# Patient Record
Sex: Female | Born: 1965 | Hispanic: Yes | Marital: Single | Smoking: Never smoker
Health system: Southern US, Community
[De-identification: ages and names within clinical notes are randomized; demographics above are authoritative.]

## PROBLEM LIST (undated history)

## (undated) DIAGNOSIS — E162 Hypoglycemia, unspecified: Secondary | ICD-10-CM

## (undated) DIAGNOSIS — R0683 Snoring: Secondary | ICD-10-CM

## (undated) DIAGNOSIS — M810 Age-related osteoporosis without current pathological fracture: Secondary | ICD-10-CM

## (undated) DIAGNOSIS — M1990 Unspecified osteoarthritis, unspecified site: Secondary | ICD-10-CM

## (undated) DIAGNOSIS — I10 Essential (primary) hypertension: Secondary | ICD-10-CM

## (undated) DIAGNOSIS — L4050 Arthropathic psoriasis, unspecified: Secondary | ICD-10-CM

## (undated) HISTORY — DX: Arthropathic psoriasis, unspecified: L40.50

## (undated) HISTORY — DX: Age-related osteoporosis without current pathological fracture: M81.0

## (undated) HISTORY — PX: KNEE SURGERY: SHX244

---

## 2012-07-23 HISTORY — PX: JOINT REPLACEMENT: SHX530

## 2013-03-25 DIAGNOSIS — L4050 Arthropathic psoriasis, unspecified: Secondary | ICD-10-CM

## 2013-03-25 HISTORY — DX: Arthropathic psoriasis, unspecified: L40.50

## 2013-03-29 ENCOUNTER — Encounter (HOSPITAL_COMMUNITY): Payer: Self-pay | Admitting: Emergency Medicine

## 2013-03-29 ENCOUNTER — Emergency Department (HOSPITAL_COMMUNITY)
Admission: EM | Admit: 2013-03-29 | Discharge: 2013-03-29 | Disposition: A | Discharge status: Home / Self Care | Payer: Medicare Other | Attending: Emergency Medicine | Admitting: Emergency Medicine

## 2013-03-29 ENCOUNTER — Emergency Department (HOSPITAL_COMMUNITY): Payer: Medicare Other

## 2013-03-29 DIAGNOSIS — Y929 Unspecified place or not applicable: Secondary | ICD-10-CM | POA: Insufficient documentation

## 2013-03-29 DIAGNOSIS — Z8639 Personal history of other endocrine, nutritional and metabolic disease: Secondary | ICD-10-CM | POA: Insufficient documentation

## 2013-03-29 DIAGNOSIS — I10 Essential (primary) hypertension: Secondary | ICD-10-CM | POA: Insufficient documentation

## 2013-03-29 DIAGNOSIS — S99919A Unspecified injury of unspecified ankle, initial encounter: Principal | ICD-10-CM

## 2013-03-29 DIAGNOSIS — Z79899 Other long term (current) drug therapy: Secondary | ICD-10-CM | POA: Insufficient documentation

## 2013-03-29 DIAGNOSIS — S8990XA Unspecified injury of unspecified lower leg, initial encounter: Secondary | ICD-10-CM | POA: Insufficient documentation

## 2013-03-29 DIAGNOSIS — M25562 Pain in left knee: Secondary | ICD-10-CM

## 2013-03-29 DIAGNOSIS — M129 Arthropathy, unspecified: Secondary | ICD-10-CM | POA: Insufficient documentation

## 2013-03-29 DIAGNOSIS — Y939 Activity, unspecified: Secondary | ICD-10-CM | POA: Insufficient documentation

## 2013-03-29 DIAGNOSIS — X500XXA Overexertion from strenuous movement or load, initial encounter: Secondary | ICD-10-CM | POA: Insufficient documentation

## 2013-03-29 DIAGNOSIS — S99929A Unspecified injury of unspecified foot, initial encounter: Principal | ICD-10-CM

## 2013-03-29 DIAGNOSIS — Z862 Personal history of diseases of the blood and blood-forming organs and certain disorders involving the immune mechanism: Secondary | ICD-10-CM | POA: Insufficient documentation

## 2013-03-29 HISTORY — DX: Hypoglycemia, unspecified: E16.2

## 2013-03-29 HISTORY — DX: Essential (primary) hypertension: I10

## 2013-03-29 HISTORY — DX: Unspecified osteoarthritis, unspecified site: M19.90

## 2013-03-29 MED ORDER — OXYCODONE HCL 5 MG PO TABS: 5.0000 mg | ORAL_TABLET | ORAL | Status: DC | PRN

## 2013-03-29 NOTE — ED Notes (Signed)
Pt states fell 2days ago landing on lt side, c/o lt knee pain, states hx of surgery to that knee

## 2013-03-29 NOTE — ED Provider Notes (Signed)
CSN: [id number]     Arrival date & time 03/29/13  7829 History   First MD Initiated Contact with Patient 03/29/13 1013     Chief Complaint  Patient presents with  . Knee Pain   (Consider location/radiation/quality/duration/timing/severity/associated sxs/prior Treatment) Patient is a 48 y.o. female presenting with knee pain. The history is provided by the patient and medical records. The history is limited by a language barrier. A language interpreter was used.  Knee Pain  This is a 48 year old female with past medical history significant for hypertension, arthritis, presenting to the ED for left knee pain.  Patient states she fell 2 days ago and twisted her left knee.  Denies head trauma or loss of consciousness. Patient states she been ambulatory since although with a great deal of pain. She had a prior left TKA of her left knee in [place], but has since moved to [place].  She does not have an orthopedic physician in the area. Denies numbness or paresthesias of left lower extremity.  States she has "stomach problems" and cannot tolerate NSAIDs.  Has previously taken oxycodone which worked well for her.  Past Medical History  Diagnosis Date  . Arthritis   . Hypoglycemia   . Hypertension    Past Surgical History  Procedure Laterality Date  . Knee surgery     No family history on file. History  Substance Use Topics  . Smoking status: Never Smoker   . Smokeless tobacco: Not on file  . Alcohol Use: No   OB History   Grav Para Term Preterm Abortions TAB SAB Ect Mult Living                 Review of Systems  Musculoskeletal: Positive for arthralgias.  All other systems reviewed and are negative.    Allergies  Hydrocodone  Home Medications   Current Outpatient Rx  Name  Route  Sig  Dispense  Refill  . augmented betamethasone dipropionate (DIPROLENE-AF) 0.05 % cream   Topical   Apply 1 application topically daily as needed (psoriasis).         . captopril  (CAPOTEN) 50 MG tablet   Oral   Take 50 mg by mouth 2 (two) times daily.         . folic acid (FOLVITE) 1 MG tablet   Oral   Take 1 mg by mouth 2 (two) times daily.         . methotrexate (RHEUMATREX) 2.5 MG tablet   Oral   Take 20 mg by mouth once a week. Each Friday Caution:Chemotherapy. Protect from light.         . Oxycodone HCl 10 MG TABS   Oral   Take 10 mg by mouth 2 (two) times daily as needed (pain).         . traMADol (ULTRAM) 50 MG tablet   Oral   Take 50 mg by mouth 3 (three) times daily as needed for moderate pain.          BP 121/69  Pulse 69  Temp(Src) 98 F (36.7 C) (Oral)  Resp 14  SpO2 98%  Physical Exam  Nursing note and vitals reviewed. Constitutional: She is oriented to person, place, and time. She appears well-developed and well-nourished. No distress.  HENT:  Head: Normocephalic and atraumatic.  Mouth/Throat: Oropharynx is clear and moist.  Eyes: Conjunctivae and EOM are normal. Pupils are equal, round, and reactive to light.  Neck: Normal range of motion. Neck supple.  Cardiovascular: Normal rate,  regular rhythm and normal heart sounds.   Pulmonary/Chest: Effort normal and breath sounds normal. No respiratory distress. She has no wheezes.  Musculoskeletal:       Left knee: She exhibits decreased range of motion. She exhibits no swelling, no effusion, no ecchymosis and no deformity. Tenderness found. Medial joint line and lateral joint line tenderness noted.  Left knee with diffuse tenderness, worse on medial and lateral joint lines; no swelling or deformity noted; limited range of motion due to pain; distal sensation intact, limping gait favoring left leg  Neurological: She is alert and oriented to person, place, and time.  Skin: Skin is warm and dry. She is not diaphoretic.  Psychiatric: She has a normal mood and affect.    ED Course  Procedures (including critical care time) Labs Review Labs Reviewed - No data to display Imaging  Review Dg Knee Complete 4 Views Left  03/29/2013   CLINICAL DATA:  History of fall complaining of left knee pain.  EXAM: LEFT KNEE - COMPLETE 4+ VIEW  COMPARISON:  No priors.  FINDINGS: Postoperative changes of left total knee arthroplasty are noted. No acute displaced fracture, subluxation or dislocation. No focal soft tissue abnormality.  IMPRESSION: 1. Status post left total knee arthroplasty without evidence of significant acute traumatic injury to the left knee.   Electronically Signed   By: Trudie Reed M.D.   On: 03/29/2013 11:37    EKG Interpretation   None       MDM   1. Knee pain, left    X-ray negative for acute fracture or dislocation. Given patient's prior knee replacement, she will followup with orthopedic, referral provided. Rx oxycodone.  Crutches given to help with ambulation at pts request. Return precautions advised.  Garlon Hatchet, PA-C 03/29/13 1303

## 2013-03-29 NOTE — Discharge Instructions (Signed)
Take the prescribed medication as directed.  Do not drive while taking this medication. Follow-up with orthopedics, Dr. Shon Baton-- call his office and schedule an appt. Return to the ED for new or worsening symptoms.

## 2013-04-01 NOTE — ED Provider Notes (Signed)
Medical screening examination/treatment/procedure(s) were performed by non-physician practitioner and as supervising physician I was immediately available for consultation/collaboration  Almendra Loria T Yoshiaki Kreuser, MD 04/01/13 1718 

## 2013-04-06 ENCOUNTER — Ambulatory Visit (INDEPENDENT_AMBULATORY_CARE_PROVIDER_SITE_OTHER): Payer: Medicare Other | Admitting: Internal Medicine

## 2013-04-06 VITALS — BP 112/78 | HR 78 | Temp 98.0°F | Resp 16 | Ht 62.0 in | Wt 206.0 lb

## 2013-04-06 DIAGNOSIS — Z96659 Presence of unspecified artificial knee joint: Secondary | ICD-10-CM

## 2013-04-06 DIAGNOSIS — F809 Developmental disorder of speech and language, unspecified: Secondary | ICD-10-CM

## 2013-04-06 DIAGNOSIS — L4050 Arthropathic psoriasis, unspecified: Secondary | ICD-10-CM | POA: Insufficient documentation

## 2013-04-06 DIAGNOSIS — M25569 Pain in unspecified knee: Secondary | ICD-10-CM

## 2013-04-06 DIAGNOSIS — M25562 Pain in left knee: Secondary | ICD-10-CM

## 2013-04-06 DIAGNOSIS — R269 Unspecified abnormalities of gait and mobility: Secondary | ICD-10-CM

## 2013-04-06 MED ORDER — OXYCODONE-ACETAMINOPHEN 10-325 MG PO TABS: 1.0000 | ORAL_TABLET | Freq: Three times a day (TID) | ORAL | Status: DC | PRN

## 2013-04-06 NOTE — Progress Notes (Signed)
   Subjective:    Patient ID: Whitney [name], female    DOB: 1965-04-29, 48 y.o.   MRN: [id number]  HPI    Review of Systems     Objective:   Physical Exam        Assessment & Plan:

## 2013-04-06 NOTE — Patient Instructions (Signed)
Knee Exercises EXERCISES RANGE OF MOTION(ROM) AND STRETCHING EXERCISES These exercises may help you when beginning to rehabilitate your injury. Your symptoms may resolve with or without further involvement from your physician, physical therapist or athletic trainer. While completing these exercises, remember:   Restoring tissue flexibility helps normal motion to return to the joints. This allows healthier, less painful movement and activity.  An effective stretch should be held for at least 30 seconds.  A stretch should never be painful. You should only feel a gentle lengthening or release in the stretched tissue. STRETCH - Knee Extension, Prone  Lie on your stomach on a firm surface, such as a bed or countertop. Place your right / left knee and leg just beyond the edge of the surface. You may wish to place a towel under the far end of your right / left thigh for comfort.  Relax your leg muscles and allow gravity to straighten your knee. Your clinician may advise you to add an ankle weight if more resistance is helpful for you.  You should feel a stretch in the back of your right / left knee. Hold this position for __________ seconds. Repeat __________ times. Complete this stretch __________ times per day. * Your physician, physical therapist or athletic trainer may ask you to add ankle weight to enhance your stretch.  RANGE OF MOTION - Knee Flexion, Active  Lie on your back with both knees straight. (If this causes back discomfort, bend your opposite knee, placing your foot flat on the floor.)  Slowly slide your heel back toward your buttocks until you feel a gentle stretch in the front of your knee or thigh.  Hold for __________ seconds. Slowly slide your heel back to the starting position. Repeat __________ times. Complete this exercise __________ times per day.  STRETCH - Quadriceps, Prone   Lie on your stomach on a firm surface, such as a bed or padded floor.  Bend your right /  left knee and grasp your ankle. If you are unable to reach, your ankle or pant leg, use a belt around your foot to lengthen your reach.  Gently pull your heel toward your buttocks. Your knee should not slide out to the side. You should feel a stretch in the front of your thigh and/or knee.  Hold this position for __________ seconds. Repeat __________ times. Complete this stretch __________ times per day.  STRETCH  Hamstrings, Supine   Lie on your back. Loop a belt or towel over the ball of your right / left foot.  Straighten your right / left knee and slowly pull on the belt to raise your leg. Do not allow the right / left knee to bend. Keep your opposite leg flat on the floor.  Raise the leg until you feel a gentle stretch behind your right / left knee or thigh. Hold this position for __________ seconds. Repeat __________ times. Complete this stretch __________ times per day.  STRENGTHENING EXERCISES These exercises may help you when beginning to rehabilitate your injury. They may resolve your symptoms with or without further involvement from your physician, physical therapist or athletic trainer. While completing these exercises, remember:   Muscles can gain both the endurance and the strength needed for everyday activities through controlled exercises.  Complete these exercises as instructed by your physician, physical therapist or athletic trainer. Progress the resistance and repetitions only as guided.  You may experience muscle soreness or fatigue, but the pain or discomfort you are trying to eliminate should  never worsen during these exercises. If this pain does worsen, stop and make certain you are following the directions exactly. If the pain is still present after adjustments, discontinue the exercise until you can discuss the trouble with your clinician. STRENGTH - Quadriceps, Isometrics  Lie on your back with your right / left leg extended and your opposite knee bent.  Gradually  tense the muscles in the front of your right / left thigh. You should see either your knee cap slide up toward your hip or increased dimpling just above the knee. This motion will push the back of the knee down toward the floor/mat/bed on which you are lying.  Hold the muscle as tight as you can without increasing your pain for __________ seconds.  Relax the muscles slowly and completely in between each repetition. Repeat __________ times. Complete this exercise __________ times per day.  STRENGTH - Quadriceps, Short Arcs   Lie on your back. Place a __________ inch towel roll under your knee so that the knee slightly bends.  Raise only your lower leg by tightening the muscles in the front of your thigh. Do not allow your thigh to rise.  Hold this position for __________ seconds. Repeat __________ times. Complete this exercise __________ times per day.  OPTIONAL ANKLE WEIGHTS: Begin with ____________________, but DO NOT exceed ____________________. Increase in 1 pound/0.5 kilogram increments.  STRENGTH - Quadriceps, Straight Leg Raises  Quality counts! Watch for signs that the quadriceps muscle is working to insure you are strengthening the correct muscles and not "cheating" by substituting with healthier muscles.  Lay on your back with your right / left leg extended and your opposite knee bent.  Tense the muscles in the front of your right / left thigh. You should see either your knee cap slide up or increased dimpling just above the knee. Your thigh may even quiver.  Tighten these muscles even more and raise your leg 4 to 6 inches off the floor. Hold for __________ seconds.  Keeping these muscles tense, lower your leg.  Relax the muscles slowly and completely in between each repetition. Repeat __________ times. Complete this exercise __________ times per day.  STRENGTH - Hamstring, Curls  Lay on your stomach with your legs extended. (If you lay on a bed, your feet may hang over the  edge.)  Tighten the muscles in the back of your thigh to bend your right / left knee up to 90 degrees. Keep your hips flat on the bed/floor.  Hold this position for __________ seconds.  Slowly lower your leg back to the starting position. Repeat __________ times. Complete this exercise __________ times per day.  OPTIONAL ANKLE WEIGHTS: Begin with ____________________, but DO NOT exceed ____________________. Increase in 1 pound/0.5 kilogram increments.  STRENGTH  Quadriceps, Squats  Stand in a door frame so that your feet and knees are in line with the frame.  Use your hands for balance, not support, on the frame.  Slowly lower your weight, bending at the hips and knees. Keep your lower legs upright so that they are parallel with the door frame. Squat only within the range that does not increase your knee pain. Never let your hips drop below your knees.  Slowly return upright, pushing with your legs, not pulling with your hands. Repeat __________ times. Complete this exercise __________ times per day.  STRENGTH - Quadriceps, Wall Slides  Follow guidelines for form closely. Increased knee pain often results from poorly placed feet or knees.  Lean against  a smooth wall or door and walk your feet out 18-24 inches. Place your feet hip-width apart.  Slowly slide down the wall or door until your knees bend __________ degrees.* Keep your knees over your heels, not your toes, and in line with your hips, not falling to either side.  Hold for __________ seconds. Stand up to rest for __________ seconds in between each repetition. Repeat __________ times. Complete this exercise __________ times per day. * Your physician, physical therapist or athletic trainer will alter this angle based on your symptoms and progress. Document Released: 01/23/2005 Document Revised: 06/03/2011 Document Reviewed: 06/23/2008 [facility] Patient Information 2014 [place], [place]. Reemplazo total de rodilla  (Total Knee  Replacement)  El reemplazo total de rodilla es un procedimiento en el que se reemplaza la articulacin de la rodilla por una articulacin artificial (articulacin protsica de la rodilla). El propsito de [place] es reducir Chief Technology Officer y Scientist, clinical (histocompatibility and immunogenetics) la funcin de la rodilla. INFORME A SU MDICO SOBRE:   Alergias que sufra.  Medicamentos que [place], incluyendo vitaminas, hierbas, gotas oftlmicas, medicamentos de venta libre y cremas.  Problemas anteriores con el uso de anestsicos.  Historia familiar relacionada con el uso de anestsicos.  Toda enfermedad de la sangre que padezca, incluso problemas de hemorragias o de coagulacin.  Cirugas previas. RIESGOS Y COMPLICACIONES  En general, el reemplazo de rodilla es un procedimiento seguro. Sin embargo, como en todo procedimiento quirrgico, pueden ocurrir complicaciones. Las complicaciones posibles asociadas con el reemplazo total de rodilla son:   Prdida de la amplitud de movimientos de la rodilla o inestabilidad.  La prtesis puede aflojarse.  Infecciones.  Dolor persistente. ANTES DEL PROCEDIMIENTO   El mdico le dir exactamente cundo debe dejar de comer y beber.  Consulte a su mdico si debe cambiar o suspender alguno de sus medicamentos habituales. PROCEDIMIENTO  Antes del procedimiento recibir un medicamento que lo adormecer (sedante). Se lo administrarn a travs de un catter insertado en una vena (va intravenosa [IV]). Luego le darn un medicamento para bloquear el dolor de la cintura para abajo (bloqueo raqudeo) o un medicamento para dormirlo (anestesia general). Tambin recibir Nature conservation officer para bloquear la sensibilidad de la pierna (bloqueo nervioso) para Acupuncturist dolor despus de la [place].  Le harn una incisin en la rodilla. El cirujano extirpar el cartlago y el hueso lesionados cortando las superficies daadas. Luego colocar un nuevo vstago de metal en la zona que le han extirpado del hueso del muslo  (fmur) y un vstago de plstico en el hueso de la parte inferior de la pierna (tibia). Esto se realiza para Engineer, production y la funcin de la rodilla. Para reponer la superficie de la rtula generalmente se utiliza una pieza de plstico.  DESPUS DEL PROCEDIMIENTO  Lo conducirn a la zona de recuperacin. Podr tener mltiples tubos para que drene el lquido de la incisin. Estos tubos se adhieren a un dispositivo que elimina los lquidos. Una vez que despierte, se encuentre estable y pueda ingerir lquidos, excepto que ocurra un imprevisto, podr volver a su habitacin. Recibir Pharmacist, community de fisioterapia segn las indicaciones del mdico. El tiempo de permanencia en el hospital despus de un reemplazo de rodilla suele ser [place] 2 a 4 das. El Conservation officer, nature que pase algn tiempo (generalmente entre 10 a 24 [employer]) en un establecimiento de rehabilitacin para ayudarlo a caminar nuevamente y a mejorar la amplitud de movimientos antes de regresar a su casa. Le prescribirn anticoagulantes para disminuir el riesgo de  formacin de cogulos en la pierna.  Document Released: 03/11/2005 Document Revised: 12/04/2011 [facility] Patient Information 2014 [place], [place].

## 2013-04-06 NOTE — Progress Notes (Signed)
   Subjective:    Patient ID: Whitney [name], female    DOB: 06-Apr-1965, 48 y.o.   MRN: [id number]  HPI 48 y.o. Female presents to clinic with left knee pain. Had total knee replacement done 8 months ago in [place]. On this knee via a Dr. Darden Palmer. Patient fell a few days ago while taking out trash and is now having severe pain. Went to emergency room on 03/29/13 due to knee pain. ED referred her to orthopeadist; was unable to receive pain meds and was referred to pain management.  Per patient ortho (Dr. Vallery Sa) looked at xray of knee that was done and and told was normal. Speaks no English and we used Lilly to interpret but she was difficult to get a good hx. Requests oxycodone for pain Review of Systems psoriasis    Objective:   Physical Exam  Constitutional: She is oriented to person, place, and time. She appears well-nourished. She appears distressed.  HENT:  Head: Normocephalic.  Eyes: EOM are normal.  Neck: Normal range of motion.  Pulmonary/Chest: Effort normal.  Musculoskeletal:       Left knee: She exhibits decreased range of motion, deformity and laceration. She exhibits no swelling, no effusion, no ecchymosis and no erythema. Tenderness found.  TKR knee not warm or red, no effusion Very tender to extend  Neurological: She is alert and oriented to person, place, and time. No cranial nerve deficit or sensory deficit. She exhibits abnormal muscle tone. Gait abnormal.  Skin: Skin is warm, dry and intact. No abrasion, no bruising and no ecchymosis noted. No erythema.  Psychiatric: Judgment normal. Her mood appears anxious. Her speech is rapid and/or pressured. She is agitated. She is not aggressive and not combative. Cognition and memory are normal.     1/5 knee xr normal     Assessment & Plan:  Contusion knee with TKR 2014 Fitted hinged knee brace/Use walker till well Oxycodone 10mg  bid 30 told her no rfs here. Refer to [facility] for eval next week/Go to pain management as  referred by [facility] ortho

## 2013-04-17 ENCOUNTER — Emergency Department (HOSPITAL_COMMUNITY): Payer: Medicare Other

## 2013-04-17 ENCOUNTER — Encounter (HOSPITAL_COMMUNITY): Payer: Self-pay | Admitting: Emergency Medicine

## 2013-04-17 ENCOUNTER — Emergency Department (HOSPITAL_COMMUNITY)
Admission: EM | Admit: 2013-04-17 | Discharge: 2013-04-17 | Disposition: A | Discharge status: Home / Self Care | Payer: Medicare Other | Attending: Emergency Medicine | Admitting: Emergency Medicine

## 2013-04-17 DIAGNOSIS — Y929 Unspecified place or not applicable: Secondary | ICD-10-CM | POA: Insufficient documentation

## 2013-04-17 DIAGNOSIS — X500XXA Overexertion from strenuous movement or load, initial encounter: Secondary | ICD-10-CM | POA: Insufficient documentation

## 2013-04-17 DIAGNOSIS — R269 Unspecified abnormalities of gait and mobility: Secondary | ICD-10-CM

## 2013-04-17 DIAGNOSIS — Z96659 Presence of unspecified artificial knee joint: Secondary | ICD-10-CM | POA: Insufficient documentation

## 2013-04-17 DIAGNOSIS — L4050 Arthropathic psoriasis, unspecified: Secondary | ICD-10-CM | POA: Insufficient documentation

## 2013-04-17 DIAGNOSIS — M25562 Pain in left knee: Secondary | ICD-10-CM

## 2013-04-17 DIAGNOSIS — Z79899 Other long term (current) drug therapy: Secondary | ICD-10-CM | POA: Insufficient documentation

## 2013-04-17 DIAGNOSIS — L409 Psoriasis, unspecified: Secondary | ICD-10-CM

## 2013-04-17 DIAGNOSIS — S99919A Unspecified injury of unspecified ankle, initial encounter: Principal | ICD-10-CM

## 2013-04-17 DIAGNOSIS — I10 Essential (primary) hypertension: Secondary | ICD-10-CM | POA: Insufficient documentation

## 2013-04-17 DIAGNOSIS — S8990XA Unspecified injury of unspecified lower leg, initial encounter: Secondary | ICD-10-CM | POA: Insufficient documentation

## 2013-04-17 DIAGNOSIS — Y9389 Activity, other specified: Secondary | ICD-10-CM | POA: Insufficient documentation

## 2013-04-17 DIAGNOSIS — S99929A Unspecified injury of unspecified foot, initial encounter: Principal | ICD-10-CM

## 2013-04-17 MED ORDER — OXYCODONE-ACETAMINOPHEN 5-325 MG PO TABS: ORAL_TABLET | ORAL | Status: DC

## 2013-04-17 MED ORDER — OXYCODONE-ACETAMINOPHEN 5-325 MG PO TABS
2.0000 | ORAL_TABLET | Freq: Once | ORAL | Status: AC
  Administered 2013-04-17: 2 via ORAL
  Filled 2013-04-17: qty 2

## 2013-04-17 MED ORDER — BETAMETHASONE DIPROPIONATE AUG 0.05 % EX CREA: 1.0000 "application " | TOPICAL_CREAM | Freq: Every day | CUTANEOUS | Status: DC | PRN

## 2013-04-17 NOTE — ED Provider Notes (Signed)
CSN: [id number]     Arrival date & time 04/17/13  2046 History  This chart was scribed for non-physician practitioner Junius Finner, working with Junius Argyle, MD by Carl Best, ED Scribe. This patient was seen in room TR11C/TR11C and the patient's care was started at 9:50 PM.    Chief Complaint  Patient presents with  . Pain    The history is provided by the patient. No language interpreter was used.   HPI Comments: Whitney [name] is a 48 y.o. female with a history of Psoriatic Arthritis who presents to the Emergency Department complaining of constant myalgias and left knee pain that started two or three hours ago today.  The patient states that while she was getting up from watching TV her knee completely shifted towards the inside.  The patient denies wearing knee braces regularly.  She states that she does not have an orthopedist now but is waiting to be referred to one in [place].  She states that she has an appointment to see a pain clinic next week.  She states that she experienced the same symptoms three weeks ago.  She states that she had surgery on her left knee 8 months ago.  She states that she would like medication for her Psoriatic Arthriitis as well.     Past Medical History  Diagnosis Date  . Arthritis   . Hypoglycemia   . Hypertension   . Psoriatic arthritis    Past Surgical History  Procedure Laterality Date  . Knee surgery     History reviewed. No pertinent family history. History  Substance Use Topics  . Smoking status: Never Smoker   . Smokeless tobacco: Not on file  . Alcohol Use: No   OB History   Grav Para Term Preterm Abortions TAB SAB Ect Mult Living                 Review of Systems  Musculoskeletal: Positive for arthralgias (left knee).  All other systems reviewed and are negative.    Allergies  Ibuprofen; Tramadol; and Hydrocodone  Home Medications   Current Outpatient Rx  Name  Route  Sig  Dispense  Refill  . captopril  (CAPOTEN) 50 MG tablet   Oral   Take 50 mg by mouth 2 (two) times daily.         . folic acid (FOLVITE) 1 MG tablet   Oral   Take 1 mg by mouth 2 (two) times daily.         . methotrexate (RHEUMATREX) 2.5 MG tablet   Oral   Take 20 mg by mouth once a week. Each Friday Caution:Chemotherapy. Protect from light.         Marland Kitchen oxyCODONE-acetaminophen (PERCOCET) 10-325 MG per tablet   Oral   Take 1 tablet by mouth every 8 (eight) hours as needed for pain.   30 tablet   0   . augmented betamethasone dipropionate (DIPROLENE-AF) 0.05 % cream   Topical   Apply 1 application topically daily as needed (psoriasis).   30 g   0   . oxyCODONE-acetaminophen (PERCOCET/ROXICET) 5-325 MG per tablet      Take 1-2 pills every 6 hours as needed for pain.   10 tablet   0    Triage Vitals: BP 125/68  Pulse 70  Temp(Src) 98.1 F (36.7 C) (Oral)  Resp 16  Ht 5\' 2"  (1.575 m)  Wt 209 lb 6 oz (94.972 kg)  BMI 38.29 kg/m2  SpO2 99%  Physical  Exam  Nursing note and vitals reviewed. Constitutional: She is oriented to person, place, and time. She appears well-developed and well-nourished. No distress.  HENT:  Head: Normocephalic and atraumatic.  Eyes: EOM are normal.  Neck: Neck supple. No tracheal deviation present.  Cardiovascular: Normal rate.   Pulmonary/Chest: Effort normal. No respiratory distress.  Musculoskeletal: Normal range of motion.  Neurological: She is alert and oriented to person, place, and time.  Skin: Skin is warm and dry.  Psychiatric: She has a normal mood and affect. Her behavior is normal.    ED Course  Procedures (including critical care time)  DIAGNOSTIC STUDIES: Oxygen Saturation is 99% on room air, normal by my interpretation.    COORDINATION OF CARE: 9:54 PM- Discussed obtaining an x-ray of the patient's left knee.  Discussed discharging the patient with pain medication and a knee brace.  The patient agreed to the treatment plan.    Labs Review Labs  Reviewed - No data to display Imaging Review Dg Knee Complete 4 Views Left  04/17/2013   CLINICAL DATA:  Fall.  Pain  EXAM: LEFT KNEE - COMPLETE 4+ VIEW  COMPARISON:  None.  FINDINGS: Previous left total knee arthroplasty. No joint effusion identified. No evidence for periprosthetic fracture or subluxation.  IMPRESSION: Negative for acute findings.  Prior left knee arthroplasty.   Electronically Signed   By: Signa Kell M.D.   On: 04/17/2013 22:58    EKG Interpretation   None       MDM   1. Left knee pain   2. Knee pain, acute, left   3. Abnormality of gait   4. S/P TKR (total knee replacement), unspecified laterality   5. Psoriasis    Pt presenting with exacerbation of left knee pain after it sounds like it gave way on her earlier today. Pt recently moved from [place] and is in the process of getting into a Pain Clinic.  Does not have f/u with orthopedics yet. Plain films: no acute findings. Rx: percocet and diprolene for psoriasis. Provided pt info for [facility] as well as for [facility] to establish care with PCP. Return precautions provided. Pt verbalized understanding and agreement with tx plan.   I personally performed the services described in this documentation, which was scribed in my presence. The recorded information has been reviewed and is accurate.   Junius Finner, PA-C 04/17/13 2342

## 2013-04-17 NOTE — ED Notes (Signed)
Discharge instructions reviewed with interpretor phones with PA and RN.

## 2013-04-17 NOTE — ED Notes (Signed)
Pt can wiggle digits, skin warm, sensation present.

## 2013-04-17 NOTE — ED Notes (Signed)
Pt's daughter picking her up.  

## 2013-04-17 NOTE — Discharge Instructions (Signed)
Artralgia  (Arthralgia)  El profesional que le asiste ha diagnosticado que usted padece de artralgia. Artralgia significa simplemente dolor en una articulación. Las causas pueden ser muchas; entre ellas se incluyen:  · Una lesión en la articulación que provoca inflamación (molestias).  · Desgaste de las articulaciones que se produce con el avance de los años (osteoartritis).  · Uso excesivo de la articulación.  · Diversas formas de artritis.  · Infecciones en la articulación.  Cualquiera que sea la causa del dolor, generalmente hay buena respuesta a los antiinflamatorios y al reposo. La excepción ocurre cuando la articulación está infectada y estos casos, si la causa es una infección bacteriana (por una bacteria), se tratan con antibióticos (medicamentos que destruyen los gérmenes).  INSTRUCCIONES PARA EL CUIDADO DOMICILIARIO  · Mantenga en reposo la zona lesionada durante el tiempo que se lo indique su médico, o el tiempo que le indique el profesional que lo asiste. Luego comience a utilizar esa articulación lentamente del modo en que se lo aconseje el profesional y a medida que el dolor se lo permita. El uso de muletas puede ser beneficioso en el caso de lesiones en el tobillo, rodilla o cadera. Si la rodilla está entablillada o enyesada, muévala y cuídela como se le indicó. Si hoy le han colocado un venda elástica o que se estira), debe retirarlo y colocarlo nuevamente cada 3 ó 4 horas. No debe aplicarlo de manera muy apretada, pero sí con la firmeza necesaria para evitar la hinchazón. Vigile los dedos de las manos y los pies y observe si se hinchan, se tornan azules, se enfrían o entumecen o siente dolor intenso. Si se presenta alguno de estos síntomas (problemas), retire el vendaje y aplíquelo nuevamente de un modo más flojo. Si estos síntomas persisten, contacte al profesional que lo asiste o acuda nuevamente a este centro.  · Durante las primeras 24 horas, mientras está acostado, mantenga elevada la  extremidad lesionada sobre 2 almohadas.  · Mientras se encuentra despierto durante el primer medio día aplique hielo durante 15 a 20 minutos, cada dos horas, para aliviar el dolor; luego 3 a 4 veces por día durante las primeras 48 horas. Ponga el hielo en una bolsa plástica y coloque una toalla entre la bolsa y la piel.  · Use la tablilla, el yeso, el vendaje elástico o el cabestrillo según se le ha indicado.  · Utilice los medicamentos de venta libre o de prescripción para el dolor, el malestar o la fiebre, según se lo indique el profesional que lo asiste. No tomeaspirina inmediatamente después de la lesión a menos que se lo haya indicado su médico. La aspirina puede ocasionarle un aumento en el sangrado y moretones en los tejidos.  · Si se le aconsejó la utilización de muletas, continúe usándolas según las instrucciones y no vuelva a soportar peso sobre la articulación lesionada hasta que se le indique que puede hacerlo.  Un dolor persistente y la incapacidad para utilizar el área afectada durante 2 ó 3 días son señales de alerta que le indican que debe consultar a un profesional para realizar un seguimiento cuanto antes. Al comienzo, una fractura (ruptura del hueso) lineal puede no ser visible en las radiografías. Un dolor e hinchazón persistentes indican que necesita una evaluación más profunda, que no debe utilizar la articulación lesionada o soportar peso (use las muletas si se le ha indicado) y/o que debe realizarse radiografías complementarias. En muchos casos las radiografías no revelan las fracturas pequeñas hasta una semana o diez   días más tarde. Concierte una cita para realizar un seguimiento con el profesional que lo asiste o con aquél al que lo hayan remitido. Es posible que un radiólogo (especialista en la lectura de radiografías) revise nuevamente sus placas. Asegúrese de averiguar cómo retirará los resultados de sus radiografías. No piense que el resultado es normal por el hecho de que no lo hayamos  llamado.  SOLICITE ATENCIÓN MÉDICA SI:  Aumentan los moretones, la hinchazón o el dolor.  SOLICITE ATENCIÓN MÉDICA DE INMEDIATO SI:  · Sus dedos están adormecidos o presentan una coloración azul.  · El dolor no responde a los medicamentos y sigue igual o empeora.  · El dolor en la articulación se intensifica.  · La temperatura eleva por encima de 38,9° C (102° F).  · Le resulta cada vez más difícil moverla.  ESTÉ SEGURO QUE:   · Comprende las instrucciones para el alta médica.  · Controlará su enfermedad.  · Solicitará atención médica de inmediato según las indicaciones.  Document Released: 03/11/2005 Document Revised: 06/03/2011  ExitCare® Patient Information ©2014 [employer].

## 2013-04-17 NOTE — ED Notes (Signed)
Spanish speaking pt, requires interpreter. Presents with generalized body pain from arthritis and left knee pain. Reports twisting knee today and hx of left knee replacement 8 months ago. Came here because pain is great. "I have an appointment for a pain clinic. I have allergy to medicines, they were using oxycodone 10 mg for pain and I am out of pain medicine" left knee edematous.

## 2013-04-18 NOTE — ED Provider Notes (Signed)
Medical screening examination/treatment/procedure(s) were performed by non-physician practitioner and as supervising physician I was immediately available for consultation/collaboration.  EKG Interpretation   None         [name] [name], MD 04/18/13 0115 

## 2013-04-21 ENCOUNTER — Encounter (HOSPITAL_COMMUNITY): Payer: Self-pay | Admitting: Emergency Medicine

## 2013-04-21 ENCOUNTER — Emergency Department (HOSPITAL_COMMUNITY)
Admission: EM | Admit: 2013-04-21 | Discharge: 2013-04-21 | Disposition: A | Discharge status: Home / Self Care | Payer: Medicare Other | Attending: Emergency Medicine | Admitting: Emergency Medicine

## 2013-04-21 DIAGNOSIS — Z8639 Personal history of other endocrine, nutritional and metabolic disease: Secondary | ICD-10-CM | POA: Insufficient documentation

## 2013-04-21 DIAGNOSIS — Z79899 Other long term (current) drug therapy: Secondary | ICD-10-CM | POA: Insufficient documentation

## 2013-04-21 DIAGNOSIS — G8929 Other chronic pain: Secondary | ICD-10-CM | POA: Insufficient documentation

## 2013-04-21 DIAGNOSIS — Z862 Personal history of diseases of the blood and blood-forming organs and certain disorders involving the immune mechanism: Secondary | ICD-10-CM | POA: Insufficient documentation

## 2013-04-21 DIAGNOSIS — Z9889 Other specified postprocedural states: Secondary | ICD-10-CM | POA: Insufficient documentation

## 2013-04-21 DIAGNOSIS — M129 Arthropathy, unspecified: Secondary | ICD-10-CM | POA: Insufficient documentation

## 2013-04-21 DIAGNOSIS — I10 Essential (primary) hypertension: Secondary | ICD-10-CM | POA: Insufficient documentation

## 2013-04-21 DIAGNOSIS — M25569 Pain in unspecified knee: Secondary | ICD-10-CM | POA: Insufficient documentation

## 2013-04-21 DIAGNOSIS — Z872 Personal history of diseases of the skin and subcutaneous tissue: Secondary | ICD-10-CM | POA: Insufficient documentation

## 2013-04-21 MED ORDER — ACETAMINOPHEN 325 MG PO TABS: 650.0000 mg | ORAL_TABLET | Freq: Four times a day (QID) | ORAL | Status: DC | PRN

## 2013-04-21 MED ORDER — ETODOLAC 500 MG PO TABS: 500.0000 mg | ORAL_TABLET | Freq: Two times a day (BID) | ORAL | Status: DC

## 2013-04-21 MED ORDER — HYDROMORPHONE HCL PF 1 MG/ML IJ SOLN
1.0000 mg | Freq: Once | INTRAMUSCULAR | Status: AC
  Administered 2013-04-21: 1 mg via INTRAMUSCULAR
  Filled 2013-04-21: qty 1

## 2013-04-21 MED ORDER — ACETAMINOPHEN-CODEINE #3 300-30 MG PO TABS: 1.0000 | ORAL_TABLET | Freq: Four times a day (QID) | ORAL | Status: DC | PRN

## 2013-04-21 NOTE — ED Provider Notes (Signed)
CSN: [id number]     Arrival date & time 04/21/13  0759 History   First MD Initiated Contact with Patient 04/21/13 0831     Chief Complaint  Patient presents with  . Knee Pain    HPI Patient presents to the emergency room with complaints of left knee pain. Patient states she has history of prior left knee surgery performed in [place]. She has had trouble with intermittent knee pain.  Over the last several weeks she has had persistent pain in her left knee. The pain increases with movement, walking, bearing weight and palpation. She denies any fevers or chills. She denies any recent injuries. The patient has been seen 3 times in the last month in the emergency department for this complaint.  He notes indicate the patient was referred to Dr. Charlann Boxer.  SHe also was seen by Dr Perrin Maltese at the [facility].  She has been prescribed oxycodone and percocet.  Pt ran out of her pain medications and continues to have pain. (rx oxycodone on the 13th for 30 tabs) Pt is waiting for an appointment at the pain clinic. Past Medical History  Diagnosis Date  . Arthritis   . Hypoglycemia   . Hypertension   . Psoriatic arthritis    Past Surgical History  Procedure Laterality Date  . Knee surgery    . Joint replacement Left     knee   History reviewed. No pertinent family history. History  Substance Use Topics  . Smoking status: Never Smoker   . Smokeless tobacco: Not on file  . Alcohol Use: No   OB History   Grav Para Term Preterm Abortions TAB SAB Ect Mult Living                 Review of Systems  All other systems reviewed and are negative.    Allergies  Ibuprofen; Tramadol; and Hydrocodone  Home Medications   Current Outpatient Rx  Name  Route  Sig  Dispense  Refill  . augmented betamethasone dipropionate (DIPROLENE-AF) 0.05 % cream   Topical   Apply 1 application topically daily as needed (psoriasis).   30 g   0   . captopril (CAPOTEN) 50 MG tablet   Oral  Take 50 mg by mouth 2 (two) times daily.         . folic acid (FOLVITE) 1 MG tablet   Oral   Take 1 mg by mouth 2 (two) times daily.         . methotrexate (RHEUMATREX) 2.5 MG tablet   Oral   Take 20 mg by mouth once a week. Each Friday Caution:Chemotherapy. Protect from light.         Marland Kitchen oxyCODONE-acetaminophen (PERCOCET) 10-325 MG per tablet   Oral   Take 1 tablet by mouth every 8 (eight) hours as needed for pain.   30 tablet   0    BP 115/87  Pulse 66  Temp(Src) 98.4 F (36.9 C) (Oral)  Resp 18  SpO2 96% Physical Exam  Nursing note and vitals reviewed. Constitutional: She appears well-developed and well-nourished. No distress.  HENT:  Head: Normocephalic and atraumatic.  Right Ear: External ear normal.  Left Ear: External ear normal.  Eyes: Conjunctivae are normal. Right eye exhibits no discharge. Left eye exhibits no discharge. No scleral icterus.  Neck: Neck supple. No tracheal deviation present.  Cardiovascular: Normal rate.   Pulmonary/Chest: Effort normal. No stridor. No respiratory distress.  Musculoskeletal: She exhibits tenderness. She exhibits no  edema.  Well healed surgical scar, no edema or erythema, pain with range of motion left knee,   Neurological: She is alert. Cranial nerve deficit: no gross deficits.  Skin: Skin is warm and dry. No rash noted.  Psychiatric: She has a normal mood and affect.    ED Course  Procedures (including critical care time)  Reviewed recent xrays which were unremarkable.  Reviewed previous ed notes and urgent care notes Pt previously referred to Dr Shon Baton.  Will encourage follow up  [place] DR[facility] DATABASE Date Dispensed Date Prescribed Quantity Dispensed Days of Supply Authorized Refills [facility] Drug Name Prescriber Prescription Number Dispenser Dispenser City Recipient Last Name Recipient First Name Date of Birth Recipient Street Address Recipient [place] Method of Payment 04/06/13 04/06/13 30 10  0 [id number] OXYCODONE-  ACETAMINOPHEN 10- [address] CH[address]IS WARREN MD [id number] [address] [place] D[place] [birth year]2067 [address] [place] 03 04/02/13 04/02/13 60 20 0 [id number] HYDROCODON- ACETAMINOPHEN 5- [address] KENNETH P [id number] [place] S[place]via [birth year]2067 [address] [place] 03 03/29/13 03/29/13 30 5  0 [id number] OXYCODONE HCL 5 MG TABLET SANDERS LISA M [id number] [place] Parveen Kimber[place] [birth year]2067 [address] [place] 03   MDM   1. Chronic knee pain     Doubt acute infection.  Chronic knee pain.  WIll give dose of pain meds here.  Pt will need to see an orthopedic doctor or pain management doctor to determine the appropriate pain medication for her.  Do not feel another narcotic rx from the ED is appropriate.   Celene Kras, MD 04/21/13 [phone]

## 2013-04-21 NOTE — ED Notes (Addendum)
Pt c/o L knee pain.  Pain score 10/10.  Pt has been seen at [facility] previously for same complaint.  Pt sts knee replacement x 8 months ago.  Pt sts she can not fully extend her leg.  No swelling or deformity noted.  Pt ambulated into department w/ some dificulty.  Spanish is Pt's first language.

## 2013-04-21 NOTE — Discharge Instructions (Signed)
Dolor crnico (Chronic Pain) El dolor crnico puede definirse como aquel que aparece y desaparece, y dura entre 3 y 6 meses o ms. Puede tener muchas causas, lo que hace difcil realizar un diagnstico. Hay muchas opciones de tratamiento disponibles. Sin embargo, Editor, commissioning un tratamiento que resulte bien para usted puede requerir diferentes abordajes hasta que se encuentre el [place]. Muchas personas se benefician con una combinacin de uno o ms tipos de tratamiento para Human resources officer. SNTOMAS  El dolor crnico puede producirse en cualquier parte del organismo y puede variar desde leve a muy intenso. Algunos tipos de dolor crnico son:  Dolor de [place].  Dolor lumbar.  Dolor por cncer.  Dolor por artritis.  Dolor neurognico. Este ltimo dolor es el resultado de lesiones nerviosas. Las personas que sufren dolor crnico tambin pueden tener otros sntomas como:  Depresin.  Enojo.  Insomnio.  Ansiedad. DIAGNSTICO  El medico har el diagnstico de la afeccin luego de un [place]. En muchos casos, el foco inicial estar en excluir trastornos que sean causa de dolor. El mdico indicar algunos anlisis para diagnosticar el problema, segn los sntomas que [place]. Entre estas pruebas se incluyen:   Anlisis de [place].   Tomografa computada.   Resonancia magntica.   Radiografas.   Ecografas.   Estudios de [employer].  Es posible que deba consultar a Music therapist.  TRATAMIENTO  Hallar un tratamiento que de resultado puede llevar mucho tiempo. Posiblemente lo derivarn a un especialista. El especialista podr indicarle medicamentos o terapias, como:   Meditacin de atencin plena o yoga.  Medicamentos o inyecciones de anestsicos o analgsicos en la columna vertebral o en la zona que la rodea.  Estimulacin elctrica local.  Acupuntura.   Terapia de masajes.   Terapia con aromas, colores, luz o sonidos.   Biorretroalimentacin.    Trabajo con un fisioterapeuta para Technical sales engineer rigidez.   Ejercicios regulares y suaves.   Terapia cognitivo conductual.   Grupos de apoyo.  En algunos casos podr indicarse la [place].  INSTRUCCIONES PARA EL CUIDADO EN EL HOGAR   Tome todos los medicamentos como le indic el mdico.   Disminuya el estrs en su vida relajndose y haciendo ciertas cosas, como escuchar msica tranquila.   Haga ejercicios o actividades segn las indicaciones del mdico.   Consuma una dieta saludable e incluya vegetales, frutas, pescado y carnes [place].   Cumpla con todas las visitas de control, segn le indique su mdico.   Asista a un grupo de apoyo con otras personas que sufren de dolor crnico. SOLICITE ATENCIN MDICA SI:   El dolor empeora.   Siente un nuevo dolor, que no tena antes.   No tolera los medicamentos que le indic el mdico.   Presenta nuevos sntomas desde la ltima consulta al mdico.  SOLICITE ATENCIN MDICA DE INMEDIATO SI:   Se siente dbil.   Tiene menos sensibilidad o siente adormecimientos.   Pierde el control de la vejiga o del intestino.   El dolor empeora repentinamente.   Comienza a tener temblores.  Comienza a sentir escalofros.  Se siente confundido.  Siente dolor en el pecho.  Le falta el aire.  ASEGRESE DE QUE:  Comprende estas instrucciones.  Controlar su afeccin.  Recibir ayuda de inmediato si no mejora o si empeora. Document Released: 03/11/2005 Document Revised: 11/11/2012 [facility] Patient Information 2014 [place], [place].     Resource Guide (Spanish)   Problemas Dentales  Pacientes con Medicaid    :  [facility] [phone] [address]                          [phone] [address] [place], [place]                          [place], [place]                                             Phone:  [phone]               Phone:   [phone] Si usted no puede pagar o no tiene seguro mdico/dental pngase en contacto con: [facility] o el departamento de salud de [place] para que le autoricen el uso de la Therapist, art para adultos.  Problemas con Dolor Crnico Pngase en contacto con la clinica de dolores cronicos en el hospital de [place], [phone].   Los pacientes deben ser [employer] por su medico de cabecera.  Si usted no tiene dinero para pagar sus medicinas Pngase en contacto con United Way:  llame al "211" o Insurance underwriter.   Phone: [phone]  Si usted no tiene mdico de [employer]      Phone: [phone]. Otras agencias que ofrecen cuidado mdico barato son:      [facility] [place]  [phone]      [facility]  [phone]      [facility]  [phone]      [facility]  [phone]      [facility]  [phone]      [facility]  [phone]  [facility]  [phone] [facility]  [phone] [facility]  [phone]  (servicios de emergencia [phone])  Abuse/Neglect [facility] Child Abuse Hotline [phone] [facility] Child Abuse Hotline [phone] (After Hours)  Emergency Shelter [facility] [phone]  Maternity Homes Room at the [place] [facility] [place] [phone] [facility] [phone]  MRSA Hotline #:   437-615-7185  Servicios de [facility] [place]    United Way           [facility] Dept. [address]                                    [address]       [address] [place]                                         Cristobal Goldmann Phone:  [phone]                            Phone: [phone]                Phone: [phone]  [facility] Phone:  [phone]  [facility] Child Abuse Hotline [phone] (After Hours)

## 2013-04-30 ENCOUNTER — Ambulatory Visit (INDEPENDENT_AMBULATORY_CARE_PROVIDER_SITE_OTHER): Payer: Medicare Other | Admitting: Emergency Medicine

## 2013-04-30 VITALS — BP 128/78 | HR 77 | Temp 98.0°F | Resp 16 | Ht 62.0 in | Wt 202.0 lb

## 2013-04-30 DIAGNOSIS — L4050 Arthropathic psoriasis, unspecified: Secondary | ICD-10-CM

## 2013-04-30 DIAGNOSIS — G894 Chronic pain syndrome: Secondary | ICD-10-CM

## 2013-04-30 DIAGNOSIS — R269 Unspecified abnormalities of gait and mobility: Secondary | ICD-10-CM

## 2013-04-30 MED ORDER — FOLIC ACID 1 MG PO TABS: 1.0000 mg | ORAL_TABLET | Freq: Two times a day (BID) | ORAL | Status: AC

## 2013-04-30 MED ORDER — BETAMETHASONE DIPROPIONATE AUG 0.05 % EX CREA: 1.0000 "application " | TOPICAL_CREAM | Freq: Every day | CUTANEOUS | Status: AC | PRN

## 2013-04-30 MED ORDER — OXYCODONE-ACETAMINOPHEN 10-325 MG PO TABS
1.0000 | ORAL_TABLET | Freq: Three times a day (TID) | ORAL | Status: DC | PRN
Start: 2013-04-30 — End: 2013-06-08

## 2013-04-30 MED ORDER — METHOTREXATE 2.5 MG PO TABS: 20.0000 mg | ORAL_TABLET | ORAL | Status: AC

## 2013-04-30 MED ORDER — CAPTOPRIL 50 MG PO TABS
50.0000 mg | ORAL_TABLET | Freq: Two times a day (BID) | ORAL | Status: DC
Start: 2013-04-30 — End: 2015-07-25

## 2013-04-30 NOTE — Patient Instructions (Signed)

## 2013-04-30 NOTE — Progress Notes (Signed)
[facility] [address] [place] [address] [phone]  Date:  04/30/2013   Name:  Whitney [name]   DOB:  1966/02/13   MRN:  [id number]  PCP:  No primary provider on file.    Chief Complaint: Knee Injury and Medication Refill   History of Present Illness:  Whitney [name] is a 48 y.o. very pleasant female patient who presents with the following:  Patient moved here from [place] a month ago and was under treatment for chronic pain caused by psoriatic arthritis.  Is awaiting medical records to enable her to establish care with an orthopedist and a pain clinic.  Underwent a left knee replacement and has been advised to have the right knee done and moved here for that reason as her daughter is here and she lived alone in [place].  Is out of all her medications.  No improvement with over the counter medications or other home remedies. Denies other complaint or health concern today.   Patient Active Problem List   Diagnosis Date Noted  . Psoriatic arthritis 04/06/2013    Past Medical History  Diagnosis Date  . Arthritis   . Hypoglycemia   . Hypertension   . Psoriatic arthritis   . Osteoporosis     Past Surgical History  Procedure Laterality Date  . Knee surgery    . Joint replacement Left     knee    History  Substance Use Topics  . Smoking status: Never Smoker   . Smokeless tobacco: Not on file  . Alcohol Use: No    No family history on file.  Allergies  Allergen Reactions  . Ibuprofen   . Tramadol   . Hydrocodone Nausea And Vomiting and Rash    Medication list has been reviewed and updated.  Current Outpatient Prescriptions on File Prior to Visit  Medication Sig Dispense Refill  . augmented betamethasone dipropionate (DIPROLENE-AF) 0.05 % cream Apply 1 application topically daily as needed (psoriasis).  30 g  0  . captopril (CAPOTEN) 50 MG tablet Take 50 mg by mouth 2 (two) times daily.      . folic acid (FOLVITE) 1 MG  tablet Take 1 mg by mouth 2 (two) times daily.      . methotrexate (RHEUMATREX) 2.5 MG tablet Take 20 mg by mouth once a week. Each Friday Caution:Chemotherapy. Protect from light.      Marland Kitchen oxyCODONE-acetaminophen (PERCOCET) 10-325 MG per tablet Take 1 tablet by mouth every 8 (eight) hours as needed for pain.  30 tablet  0  . acetaminophen (TYLENOL) 325 MG tablet Take 2 tablets (650 mg total) by mouth every 6 (six) hours as needed for moderate pain.  30 tablet  1   No current facility-administered medications on file prior to visit.    Review of Systems:  As per HPI, otherwise negative.    Physical Examination: Filed Vitals:   04/30/13 1510  BP: 128/78  Pulse: 77  Temp: 98 F (36.7 C)  Resp: 16   Filed Vitals:   04/30/13 1510  Height: 5\' 2"  (1.575 m)  Weight: 202 lb (91.627 kg)   Body mass index is 36.94 kg/(m^2). Ideal Body Weight: Weight in (lb) to have BMI = 25: 136.4  GEN: WDWN, NAD, Non-toxic, A & O x 3 HEENT: Atraumatic, Normocephalic. Neck supple. No masses, No LAD. Ears and Nose: No external deformity. CV: RRR, No M/G/R. No JVD. No thrill. No extra heart sounds. PULM: CTA B, no wheezes, crackles, rhonchi.  No retractions. No resp. distress. No accessory muscle use. ABD: S, NT, ND, +BS. No rebound. No HSM. EXTR: No c/c/e  Left knee arthroplasty.  Right knee swollen and limited range of motion NEURO assists gait with walker.  antalgic.  PSYCH: Normally interactive. Conversant. Not depressed or anxious appearing.  Calm demeanor.    Assessment and Plan: Chronic pain Awaiting right knee arthroplasty Hypertension Refill meds Encouraged to speed up access to records.     Signed,  Phillips Odor, MD

## 2013-05-11 ENCOUNTER — Ambulatory Visit
Admission: RE | Admit: 2013-05-11 | Discharge: 2013-05-11 | Disposition: A | Discharge status: Home / Self Care | Payer: Medicare Other | Source: Ambulatory Visit | Attending: Family Medicine | Admitting: Family Medicine

## 2013-05-11 ENCOUNTER — Other Ambulatory Visit: Payer: Self-pay | Admitting: Family Medicine

## 2013-05-11 DIAGNOSIS — M25562 Pain in left knee: Secondary | ICD-10-CM

## 2013-05-13 ENCOUNTER — Other Ambulatory Visit: Payer: Self-pay | Admitting: Emergency Medicine

## 2013-05-13 DIAGNOSIS — S83209A Unspecified tear of unspecified meniscus, current injury, unspecified knee, initial encounter: Secondary | ICD-10-CM

## 2013-05-28 ENCOUNTER — Other Ambulatory Visit: Payer: Self-pay | Admitting: Orthopaedic Surgery

## 2013-06-02 ENCOUNTER — Encounter (HOSPITAL_BASED_OUTPATIENT_CLINIC_OR_DEPARTMENT_OTHER): Payer: Self-pay | Admitting: *Deleted

## 2013-06-02 NOTE — H&P (Signed)
Whitney [name] is an 48 y.o. female.   Chief Complaint: Right knee pain HPI: This is a 48 year old female whose recently moved here from [place] and presents to our office complaining of increased right knee pain at the joint line.  Pain with ambulation and some swelling.  Decreased range of motion.  She localizes most of her pain over the medial joint line.  Recent MRI scan dated to/17/15 shows a oblique tear of the posterior horn of the medial meniscus.  Also horizontal tear body of the lateral meniscus.  We have discussed proceeding with knee arthroscopy to improve function and decrease pain.She has had previous right knee arthroscopies in the past.  Past Medical History  Diagnosis Date  . Arthritis   . Hypoglycemia   . Hypertension   . Psoriatic arthritis   . Osteoporosis   . Snores     Past Surgical History  Procedure Laterality Date  . Knee surgery    . Joint replacement Left 5/14    knee    History reviewed. No pertinent family history. Social History:  reports that she has never smoked. She does not have any smokeless tobacco history on file. She reports that she does not drink alcohol. Her drug history is not on file.  Allergies:  Allergies  Allergen Reactions  . Ibuprofen   . Tramadol   . Hydrocodone Nausea And Vomiting and Rash    No prescriptions prior to admission    No results found for this or any previous visit (from the past 48 hour(s)). No results found.  Review of Systems  Constitutional: Negative.   HENT: Negative.   Eyes: Negative.   Respiratory: Negative.   Cardiovascular: Negative.   Gastrointestinal: Negative.   Genitourinary: Negative.   Musculoskeletal: Positive for joint pain.  Skin: Negative.   Neurological: Negative.   Endo/Heme/Allergies: Negative.   Psychiatric/Behavioral: Negative.     Height 5\' 2"  (1.575 m), weight 90.719 kg (200 lb). Physical Exam  Constitutional: She appears well-developed.  HENT:  Head: Normocephalic.   Eyes: Pupils are equal, round, and reactive to light.  Neck: Normal range of motion.  Cardiovascular: Normal heart sounds.   Respiratory: Effort normal.  GI: Soft.  Musculoskeletal:  Right knee exam no significant fluid.  Pain on both medial and lateral joint lines.  McMurray test positive for pain, mostly medial.  Range of motion 0-1 30.  Neurological: She is alert.  Skin: Skin is dry.  Psychiatric: She has a normal mood and affect.     Assessment/Plan Assessment: Right knee torn medial and lateral meniscus by MRI scan history of arthroscopy x3. Plan: We have discussed proceeding with an arthroscopy of the right knee to take care of the meniscus tears and decrease pain and improve function.  We have discussed the risks of anesthesia, infection and DVT associated with this type procedure.  She will need multiple weeks of recovery as well as potential physical therapy to optimize her results.  Renatta Shrieves R 06/02/2013, 12:49 PM

## 2013-06-02 NOTE — Progress Notes (Signed)
Will come in for ekg-bmet-just moved from [place]. Requested spanish interpreter.denies any cardiac or sleep apnes-chronic pain.

## 2013-06-07 ENCOUNTER — Encounter (HOSPITAL_BASED_OUTPATIENT_CLINIC_OR_DEPARTMENT_OTHER)
Admission: RE | Admit: 2013-06-07 | Discharge: 2013-06-07 | Disposition: A | Discharge status: Home / Self Care | Payer: Medicare Other | Source: Ambulatory Visit | Attending: Orthopaedic Surgery | Admitting: Orthopaedic Surgery

## 2013-06-07 LAB — BASIC METABOLIC PANEL
BUN: 12 mg/dL (ref 6–23)
CALCIUM: 9.8 mg/dL (ref 8.4–10.5)
CO2: 25 mEq/L (ref 19–32)
CREATININE: 0.52 mg/dL (ref 0.50–1.10)
Chloride: 103 mEq/L (ref 96–112)
GFR calc non Af Amer: >90 mL/min (ref >90)
Glucose, Bld: 90 mg/dL (ref 70–99)
Potassium: 4.9 mEq/L (ref 3.7–5.3)
Sodium: 141 mEq/L (ref 137–147)

## 2013-06-08 ENCOUNTER — Encounter (HOSPITAL_BASED_OUTPATIENT_CLINIC_OR_DEPARTMENT_OTHER)
Admission: RE | Disposition: A | Discharge status: Home / Self Care | Payer: Self-pay | Source: Ambulatory Visit | Attending: Orthopaedic Surgery

## 2013-06-08 ENCOUNTER — Ambulatory Visit (HOSPITAL_BASED_OUTPATIENT_CLINIC_OR_DEPARTMENT_OTHER)
Admission: RE | Admit: 2013-06-08 | Discharge: 2013-06-08 | Disposition: A | Discharge status: Home / Self Care | Payer: Medicare Other | Source: Ambulatory Visit | Attending: Orthopaedic Surgery | Admitting: Orthopaedic Surgery

## 2013-06-08 ENCOUNTER — Ambulatory Visit (HOSPITAL_BASED_OUTPATIENT_CLINIC_OR_DEPARTMENT_OTHER): Payer: Medicare Other | Admitting: Anesthesiology

## 2013-06-08 ENCOUNTER — Encounter (HOSPITAL_BASED_OUTPATIENT_CLINIC_OR_DEPARTMENT_OTHER): Payer: Self-pay | Admitting: Anesthesiology

## 2013-06-08 ENCOUNTER — Encounter (HOSPITAL_BASED_OUTPATIENT_CLINIC_OR_DEPARTMENT_OTHER): Payer: Medicare Other | Admitting: Anesthesiology

## 2013-06-08 DIAGNOSIS — R269 Unspecified abnormalities of gait and mobility: Secondary | ICD-10-CM

## 2013-06-08 DIAGNOSIS — S83289A Other tear of lateral meniscus, current injury, unspecified knee, initial encounter: Secondary | ICD-10-CM | POA: Insufficient documentation

## 2013-06-08 DIAGNOSIS — I10 Essential (primary) hypertension: Secondary | ICD-10-CM | POA: Insufficient documentation

## 2013-06-08 DIAGNOSIS — X58XXXA Exposure to other specified factors, initial encounter: Secondary | ICD-10-CM | POA: Insufficient documentation

## 2013-06-08 DIAGNOSIS — L4050 Arthropathic psoriasis, unspecified: Secondary | ICD-10-CM | POA: Insufficient documentation

## 2013-06-08 DIAGNOSIS — S83209A Unspecified tear of unspecified meniscus, current injury, unspecified knee, initial encounter: Secondary | ICD-10-CM

## 2013-06-08 DIAGNOSIS — Z9660 Presence of unspecified orthopedic joint implant: Secondary | ICD-10-CM | POA: Insufficient documentation

## 2013-06-08 DIAGNOSIS — IMO0002 Reserved for concepts with insufficient information to code with codable children: Secondary | ICD-10-CM | POA: Insufficient documentation

## 2013-06-08 DIAGNOSIS — M810 Age-related osteoporosis without current pathological fracture: Secondary | ICD-10-CM | POA: Insufficient documentation

## 2013-06-08 HISTORY — DX: Snoring: R06.83

## 2013-06-08 HISTORY — PX: KNEE ARTHROSCOPY: SHX127

## 2013-06-08 LAB — POCT HEMOGLOBIN-HEMACUE: Hemoglobin: 14.2 g/dL (ref 12.0–15.0)

## 2013-06-08 SURGERY — ARTHROSCOPY, KNEE
Anesthesia: General | Site: Knee | Laterality: Right

## 2013-06-08 MED ORDER — ONDANSETRON HCL 4 MG/2ML IJ SOLN
INTRAMUSCULAR | Status: DC | PRN
  Administered 2013-06-08: 4 mg via INTRAVENOUS

## 2013-06-08 MED ORDER — LACTATED RINGERS IV SOLN
INTRAVENOUS | Status: DC
  Administered 2013-06-08: 20 mL/h via INTRAVENOUS
  Administered 2013-06-08: 13:00:00 via INTRAVENOUS

## 2013-06-08 MED ORDER — BUPIVACAINE-EPINEPHRINE 0.5% -1:200000 IJ SOLN
INTRAMUSCULAR | Status: DC | PRN
  Administered 2013-06-08: 20 mL

## 2013-06-08 MED ORDER — FENTANYL CITRATE 0.05 MG/ML IJ SOLN
INTRAMUSCULAR | Status: DC | PRN
  Administered 2013-06-08 (×3): 50 ug via INTRAVENOUS

## 2013-06-08 MED ORDER — LIDOCAINE HCL (CARDIAC) 20 MG/ML IV SOLN
INTRAVENOUS | Status: DC | PRN
  Administered 2013-06-08: 100 mg via INTRAVENOUS

## 2013-06-08 MED ORDER — DEXAMETHASONE SODIUM PHOSPHATE 10 MG/ML IJ SOLN
INTRAMUSCULAR | Status: DC | PRN
  Administered 2013-06-08: 10 mg via INTRAVENOUS

## 2013-06-08 MED ORDER — CEFAZOLIN SODIUM-DEXTROSE 2-3 GM-% IV SOLR
2.0000 g | INTRAVENOUS | Status: AC
  Administered 2013-06-08: 2 g via INTRAVENOUS

## 2013-06-08 MED ORDER — MEPERIDINE HCL 25 MG/ML IJ SOLN: 6.2500 mg | INTRAMUSCULAR | Status: DC | PRN

## 2013-06-08 MED ORDER — OXYCODONE HCL 5 MG/5ML PO SOLN: 5.0000 mg | Freq: Once | ORAL | Status: DC | PRN

## 2013-06-08 MED ORDER — OXYCODONE-ACETAMINOPHEN 10-325 MG PO TABS: 1.0000 | ORAL_TABLET | Freq: Four times a day (QID) | ORAL | Status: DC | PRN

## 2013-06-08 MED ORDER — PROMETHAZINE HCL 25 MG/ML IJ SOLN
INTRAMUSCULAR | Status: AC
  Filled 2013-06-08: qty 1

## 2013-06-08 MED ORDER — FENTANYL CITRATE 0.05 MG/ML IJ SOLN: 50.0000 ug | INTRAMUSCULAR | Status: DC | PRN

## 2013-06-08 MED ORDER — BUPIVACAINE-EPINEPHRINE PF 0.5-1:200000 % IJ SOLN
INTRAMUSCULAR | Status: AC
  Filled 2013-06-08: qty 30

## 2013-06-08 MED ORDER — METHYLPREDNISOLONE ACETATE 80 MG/ML IJ SUSP
INTRAMUSCULAR | Status: DC | PRN
  Administered 2013-06-08: 80 mg

## 2013-06-08 MED ORDER — SODIUM CHLORIDE 0.9 % IR SOLN
Status: DC | PRN
  Administered 2013-06-08: 3000 mL

## 2013-06-08 MED ORDER — PROPOFOL 10 MG/ML IV BOLUS
INTRAVENOUS | Status: DC | PRN
  Administered 2013-06-08: 200 mg via INTRAVENOUS

## 2013-06-08 MED ORDER — MIDAZOLAM HCL 2 MG/2ML IJ SOLN: 1.0000 mg | INTRAMUSCULAR | Status: DC | PRN

## 2013-06-08 MED ORDER — LACTATED RINGERS IV SOLN: INTRAVENOUS | Status: DC

## 2013-06-08 MED ORDER — HYDROMORPHONE HCL PF 1 MG/ML IJ SOLN
0.2500 mg | INTRAMUSCULAR | Status: DC | PRN
  Administered 2013-06-08 (×2): 0.5 mg via INTRAVENOUS

## 2013-06-08 MED ORDER — MORPHINE SULFATE 4 MG/ML IJ SOLN
INTRAMUSCULAR | Status: DC | PRN
  Administered 2013-06-08: 4 mg

## 2013-06-08 MED ORDER — MORPHINE SULFATE 4 MG/ML IJ SOLN
INTRAMUSCULAR | Status: AC
  Filled 2013-06-08: qty 1

## 2013-06-08 MED ORDER — CHLORHEXIDINE GLUCONATE 4 % EX LIQD: 60.0000 mL | Freq: Once | CUTANEOUS | Status: DC

## 2013-06-08 MED ORDER — METHYLPREDNISOLONE ACETATE 80 MG/ML IJ SUSP
INTRAMUSCULAR | Status: AC
  Filled 2013-06-08: qty 1

## 2013-06-08 MED ORDER — HYDROMORPHONE HCL PF 1 MG/ML IJ SOLN
INTRAMUSCULAR | Status: AC
  Filled 2013-06-08: qty 1

## 2013-06-08 MED ORDER — FENTANYL CITRATE 0.05 MG/ML IJ SOLN
INTRAMUSCULAR | Status: AC
  Filled 2013-06-08: qty 6

## 2013-06-08 MED ORDER — PROMETHAZINE HCL 25 MG/ML IJ SOLN
6.2500 mg | INTRAMUSCULAR | Status: DC | PRN
  Administered 2013-06-08: 6.25 mg via INTRAVENOUS

## 2013-06-08 MED ORDER — MIDAZOLAM HCL 5 MG/5ML IJ SOLN
INTRAMUSCULAR | Status: DC | PRN
  Administered 2013-06-08: 2 mg via INTRAVENOUS

## 2013-06-08 MED ORDER — OXYCODONE HCL 5 MG PO TABS: 5.0000 mg | ORAL_TABLET | Freq: Once | ORAL | Status: DC | PRN

## 2013-06-08 MED ORDER — MIDAZOLAM HCL 2 MG/2ML IJ SOLN
INTRAMUSCULAR | Status: AC
  Filled 2013-06-08: qty 2

## 2013-06-08 SURGICAL SUPPLY — 42 items
BANDAGE ELASTIC 6 VELCRO ST LF (GAUZE/BANDAGES/DRESSINGS) ×3 IMPLANT
BLADE CUDA 5.5 (BLADE) IMPLANT
BLADE GREAT WHITE 4.2 (BLADE) ×2 IMPLANT
BLADE GREAT WHITE 4.2MM (BLADE) ×1
BNDG GAUZE ELAST 4 BULKY (GAUZE/BANDAGES/DRESSINGS) ×3 IMPLANT
CANISTER SUCT 3000ML (MISCELLANEOUS) IMPLANT
DRAPE ARTHROSCOPY W/POUCH 114 (DRAPES) ×3 IMPLANT
DRAPE U-SHAPE 47X51 STRL (DRAPES) ×3 IMPLANT
DRSG EMULSION OIL 3X3 NADH (GAUZE/BANDAGES/DRESSINGS) ×3 IMPLANT
DURAPREP 26ML APPLICATOR (WOUND CARE) ×3 IMPLANT
ELECT MENISCUS 165MM 90D (ELECTRODE) IMPLANT
ELECT REM PT RETURN 9FT ADLT (ELECTROSURGICAL)
ELECTRODE REM PT RTRN 9FT ADLT (ELECTROSURGICAL) IMPLANT
GLOVE BIO SURGEON STRL SZ8.5 (GLOVE) ×3 IMPLANT
GLOVE BIOGEL PI IND STRL 8 (GLOVE) ×1 IMPLANT
GLOVE BIOGEL PI IND STRL 8.5 (GLOVE) ×1 IMPLANT
GLOVE BIOGEL PI INDICATOR 8 (GLOVE) ×2
GLOVE BIOGEL PI INDICATOR 8.5 (GLOVE) ×2
GLOVE EXAM NITRILE MD LF STRL (GLOVE) ×6 IMPLANT
GLOVE SS BIOGEL STRL SZ 8 (GLOVE) ×1 IMPLANT
GLOVE SUPERSENSE BIOGEL SZ 8 (GLOVE) ×2
GLOVE SURG SS PI 7.0 STRL IVOR (GLOVE) ×3 IMPLANT
GOWN STRL REUS W/ TWL LRG LVL3 (GOWN DISPOSABLE) ×2 IMPLANT
GOWN STRL REUS W/ TWL XL LVL3 (GOWN DISPOSABLE) ×1 IMPLANT
GOWN STRL REUS W/TWL LRG LVL3 (GOWN DISPOSABLE) ×4
GOWN STRL REUS W/TWL XL LVL3 (GOWN DISPOSABLE) ×2
IV NS IRRIG 3000ML ARTHROMATIC (IV SOLUTION) ×6 IMPLANT
KNEE WRAP E Z 3 GEL PACK (MISCELLANEOUS) ×3 IMPLANT
MANIFOLD NEPTUNE II (INSTRUMENTS) IMPLANT
PACK ARTHROSCOPY DSU (CUSTOM PROCEDURE TRAY) ×3 IMPLANT
PACK BASIN DAY SURGERY FS (CUSTOM PROCEDURE TRAY) ×3 IMPLANT
PENCIL BUTTON HOLSTER BLD 10FT (ELECTRODE) IMPLANT
SET ARTHROSCOPY TUBING (MISCELLANEOUS) ×2
SET ARTHROSCOPY TUBING LN (MISCELLANEOUS) ×1 IMPLANT
SHEET MEDIUM DRAPE 40X70 STRL (DRAPES) ×3 IMPLANT
SPONGE GAUZE 4X4 12PLY (GAUZE/BANDAGES/DRESSINGS) ×3 IMPLANT
SYR 3ML 18GX1 1/2 (SYRINGE) IMPLANT
TOWEL OR 17X24 6PK STRL BLUE (TOWEL DISPOSABLE) ×3 IMPLANT
TOWEL OR NON WOVEN STRL DISP B (DISPOSABLE) ×3 IMPLANT
WAND 30 DEG SABER W/CORD (SURGICAL WAND) IMPLANT
WAND STAR VAC 90 (SURGICAL WAND) IMPLANT
WATER STERILE IRR 1000ML POUR (IV SOLUTION) ×3 IMPLANT

## 2013-06-08 NOTE — Transfer of Care (Signed)
Immediate Anesthesia Transfer of Care Note  Patient: Whitney [name]  Procedure(s) Performed: Procedure(s): RIGHT ARTHROSCOPY KNEE (Right)  Patient Location: PACU  Anesthesia Type:General  Level of Consciousness: awake and sedated  Airway & Oxygen Therapy: Patient Spontanous Breathing and Patient connected to face mask oxygen  Post-op Assessment: Report given to PACU RN and Post -op Vital signs reviewed and stable  Post vital signs: Reviewed and stable  Complications: No apparent anesthesia complications

## 2013-06-08 NOTE — Anesthesia Postprocedure Evaluation (Signed)
  Anesthesia Post-op Note  Patient: Whitney [name]  Procedure(s) Performed: Procedure(s): RIGHT ARTHROSCOPY KNEE W/ PARTIAL MEDIAL & LATERAL MENISECTOMY & CHONDROPLASTY (Right)  Patient Location: PACU  Anesthesia Type:General  Level of Consciousness: awake, alert , oriented and patient cooperative  Airway and Oxygen Therapy: Patient Spontanous Breathing  Post-op Pain: none  Post-op Assessment: Post-op Vital signs reviewed, Patient's Cardiovascular Status Stable, Respiratory Function Stable, Patent Airway, No signs of Nausea or vomiting and Pain level controlled  Post-op Vital Signs: Reviewed and stable  Complications: No apparent anesthesia complications

## 2013-06-08 NOTE — Discharge Instructions (Signed)

## 2013-06-08 NOTE — Anesthesia Preprocedure Evaluation (Addendum)
Anesthesia Evaluation  Patient identified by MRN, date of birth, ID band Patient awake    Reviewed: Allergy & Precautions, H&P , NPO status , Patient's Chart, lab work & pertinent test results  History of Anesthesia Complications Negative for: history of anesthetic complications  Airway Mallampati: I TM Distance: >3 FB Neck ROM: Full    Dental  (+) Teeth Intact, Dental Advisory Given   Pulmonary neg pulmonary ROS,  breath sounds clear to auscultation  Pulmonary exam normal       Cardiovascular hypertension, Pt. on medications Rhythm:Regular Rate:Normal     Neuro/Psych negative neurological ROS     GI/Hepatic negative GI ROS, Neg liver ROS,   Endo/Other  Morbid obesity  Renal/GU negative Renal ROS     Musculoskeletal   Abdominal (+) + obese,   Peds  Hematology   Anesthesia Other Findings   Reproductive/Obstetrics                          Anesthesia Physical Anesthesia Plan  ASA: II  Anesthesia Plan: General   Post-op Pain Management:    Induction: Intravenous  Airway Management Planned: LMA  Additional Equipment:   Intra-op Plan:   Post-operative Plan:   Informed Consent: I have reviewed the patients History and Physical, chart, labs and discussed the procedure including the risks, benefits and alternatives for the proposed anesthesia with the patient or authorized representative who has indicated his/her understanding and acceptance.   Dental advisory given  Plan Discussed with: CRNA and Surgeon  Anesthesia Plan Comments: (Plan routine monitors, GA- LMA OK)        Anesthesia Quick Evaluation

## 2013-06-08 NOTE — Brief Op Note (Signed)
Whitney [name] [id number] 06/08/2013   PRE-OP DIAGNOSIS: right knee TMM/TLM and DJD  POST-OP DIAGNOSIS: same  PROCEDURE: right PMM/PLM and AB/CP  ANESTHESIA: general  [name]   Dictation #:  ?

## 2013-06-08 NOTE — Interval H&P Note (Signed)
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 After consideration of risks, benefits and other options for treatment, the patient has consented to  Procedure(s): RIGHT ARTHROSCOPY KNEE (Right) as a surgical intervention .  The patient's history has been reviewed, patient examined, no change in status, stable for surgery.  I have reviewed the patient's chart and labs.  Questions were answered to the patient's satisfaction.     Alvetta Hidrogo G

## 2013-06-08 NOTE — Anesthesia Procedure Notes (Signed)
Procedure Name: LMA Insertion Date/Time: 06/08/2013 1:12 PM Performed by: Caren Macadam Pre-anesthesia Checklist: Patient identified, Emergency Drugs available, Suction available and Patient being monitored Patient Re-evaluated:Patient Re-evaluated prior to inductionOxygen Delivery Method: Circle System Utilized Preoxygenation: Pre-oxygenation with 100% oxygen Intubation Type: IV induction Ventilation: Mask ventilation without difficulty LMA: LMA inserted LMA Size: 4.0 Number of attempts: 1 Airway Equipment and Method: bite block Placement Confirmation: positive ETCO2 and breath sounds checked- equal and bilateral Tube secured with: Tape Dental Injury: Teeth and Oropharynx as per pre-operative assessment

## 2013-06-09 ENCOUNTER — Encounter (HOSPITAL_BASED_OUTPATIENT_CLINIC_OR_DEPARTMENT_OTHER): Payer: Self-pay | Admitting: Orthopaedic Surgery

## 2013-06-09 NOTE — Op Note (Signed)
NAMEKEIRSTAN, Whitney [name]              ACCOUNT NO.:  [id number]  MEDICAL RECORD NO.:  [id number]  LOCATION:                                 FACILITY:  PHYSICIAN:  Lubertha Basque. Kwynn Schlotter, M.D.DATE OF BIRTH:  06/02/1965  DATE OF PROCEDURE:  06/08/2013 DATE OF DISCHARGE:  06/08/2013                              OPERATIVE REPORT   PREOPERATIVE DIAGNOSES: 1. Right knee torn medial and torn lateral meniscus. 2. Right knee degenerative joint disease.  POSTOPERATIVE DIAGNOSES: 1. Right knee torn medial and torn lateral meniscus. 2. Right knee degenerative joint disease.  PROCEDURES: 1. Right knee partial medial and partial lateral meniscectomy. 2. Right knee abrasion chondroplasty, medial and patellofemoral.  ANESTHESIA:  General.  ATTENDING SURGEON:  Lubertha Basque. Jerl Santos, M.D.  ASSISTANT:  Elodia Florence, PA  INDICATION FOR PROCEDURE:  The patient is a 48 year old woman with a long history of right knee pain.  This has persisted despite various injections and pills.  She has actually had 3 arthroscopies of this joint elsewhere up in [place].  She has recently relocated to the [facility] and was seen in the office a while ago with this continued knee pain.  She was interested in a knee replacement, but her x-rays really could not justify the type of intervention.  My hope was that we could perform an arthroscopy to lead to some improvement. Informed operative consent was obtained after discussion of possible complications including reaction to anesthesia and infection.  SUMMARY OF FINDINGS AND PROCEDURE:  Under general anesthesia, an arthroscopy of the right knee was performed.  Suprapatellar pouch was benign while the patellofemoral joint exhibited some grade 3 and focal grade 4 change at the apex of intertrochlear groove addressed with chondroplasty and abrasion of bleeding bone in 1 small area.  Medial compartment had an authentic radial tear of the posterior horn consistent with  her MRI.  About 5% partial medial meniscectomy was done. She had some grade 3 and small areas of grade 4 changes across the broad portion of the medial femoral condyle.  ACL looked normal.  She did have a loose body in the anterior aspect of the knee which was loosely tethered to tissues near the ACL at the tibia.  This was removed and measured about 5 mm in diameter.  Lateral compartment exhibited a small anterior horn lateral meniscus tear addressed with a brief partial lateral meniscectomy.  She had minimal degenerative change in the lateral compartment.  The knee was thoroughly irrigated followed by discharge home.  I will contact her by phone tonight.  DESCRIPTION OF PROCEDURE:  The patient was taken to operating suite where general anesthetic was applied without difficulty.  She was positioned supine and prepped and draped in normal sterile fashion. After the administration of IV Kefzol and an appropriate time-out, an arthroscopy of the right knee was performed through total of 2 portals. Findings were as noted above and procedure consisted predominantly of the partial medial meniscectomy which was done with basket and shaver. We then performed the chondroplasty and abrasions as listed above. Lateral compartment exhibited a small anterior horn tear, which we also took care of.  The knee was thoroughly irrigated followed by placement  of Marcaine with epinephrine and morphine plus some Depo-Medrol. Adaptic was placed over the portals followed by dry gauze and loose Ace wrap.  ESTIMATED BLOOD LOSS AND FLUIDS:  Can be obtained from Anesthesia records.  DISPOSITION:  The patient was extubated in operating room and taken to recovery room in stable condition.  She was to go home same-day and follow up in the office less than a week.  I will contact her by phone tonight.     Lubertha Basque Jerl Santos, M.D.     PGD/MEDQ  D:  06/08/2013  T:  06/09/2013  Job:  [id number]

## 2013-06-10 ENCOUNTER — Encounter: Payer: Self-pay | Admitting: Physical Medicine & Rehabilitation

## 2013-07-30 ENCOUNTER — Ambulatory Visit: Payer: Medicare Other | Admitting: Family Medicine

## 2013-08-10 ENCOUNTER — Other Ambulatory Visit: Payer: Self-pay | Admitting: Orthopaedic Surgery

## 2013-08-10 ENCOUNTER — Encounter: Payer: Medicare Other | Attending: Physical Medicine & Rehabilitation | Admitting: Physical Medicine & Rehabilitation

## 2013-08-31 ENCOUNTER — Encounter (HOSPITAL_COMMUNITY)
Admission: RE | Admit: 2013-08-31 | Discharge: 2013-08-31 | Disposition: A | Discharge status: Home / Self Care | Payer: Medicare Other | Source: Ambulatory Visit | Attending: Orthopaedic Surgery | Admitting: Orthopaedic Surgery

## 2013-08-31 ENCOUNTER — Ambulatory Visit (HOSPITAL_COMMUNITY)
Admission: RE | Admit: 2013-08-31 | Discharge: 2013-08-31 | Disposition: A | Discharge status: Home / Self Care | Payer: Medicare Other | Source: Ambulatory Visit | Attending: Orthopaedic Surgery | Admitting: Orthopaedic Surgery

## 2013-08-31 ENCOUNTER — Encounter (HOSPITAL_COMMUNITY): Payer: Self-pay

## 2013-08-31 DIAGNOSIS — Z01818 Encounter for other preprocedural examination: Secondary | ICD-10-CM | POA: Diagnosis present

## 2013-08-31 DIAGNOSIS — Z01812 Encounter for preprocedural laboratory examination: Secondary | ICD-10-CM | POA: Diagnosis not present

## 2013-08-31 LAB — TYPE AND SCREEN
ABO/RH(D): O POS
Antibody Screen: NEGATIVE

## 2013-08-31 LAB — SURGICAL PCR SCREEN
MRSA, PCR: NEGATIVE
Staphylococcus aureus: POSITIVE — AB

## 2013-08-31 LAB — URINALYSIS, ROUTINE W REFLEX MICROSCOPIC
Bilirubin Urine: NEGATIVE
GLUCOSE, UA: NEGATIVE mg/dL
Hgb urine dipstick: NEGATIVE
Ketones, ur: NEGATIVE mg/dL
Leukocytes, UA: NEGATIVE
Nitrite: NEGATIVE
PH: 5 (ref 5.0–8.0)
PROTEIN: NEGATIVE mg/dL
Specific Gravity, Urine: 1.027 (ref 1.005–1.030)
Urobilinogen, UA: 1 mg/dL (ref 0.0–1.0)

## 2013-08-31 LAB — BASIC METABOLIC PANEL
BUN: 15 mg/dL (ref 6–23)
CHLORIDE: 102 meq/L (ref 96–112)
CO2: 24 mEq/L (ref 19–32)
Calcium: 10.1 mg/dL (ref 8.4–10.5)
Creatinine, Ser: 0.57 mg/dL (ref 0.50–1.10)
GFR calc Af Amer: >90 mL/min (ref >90)
GFR calc non Af Amer: >90 mL/min (ref >90)
GLUCOSE: 95 mg/dL (ref 70–99)
Potassium: 4.3 mEq/L (ref 3.7–5.3)
Sodium: 140 mEq/L (ref 137–147)

## 2013-08-31 LAB — CBC WITH DIFFERENTIAL/PLATELET
BASOS PCT: 1 % (ref 0–1)
Basophils Absolute: 0.1 10*3/uL (ref 0.0–0.1)
EOS ABS: 0.1 10*3/uL (ref 0.0–0.7)
EOS PCT: 2 % (ref 0–5)
HEMATOCRIT: 44.5 % (ref 36.0–46.0)
Hemoglobin: 14.3 g/dL (ref 12.0–15.0)
Lymphocytes Relative: 43 % (ref 12–46)
Lymphs Abs: 3.2 10*3/uL (ref 0.7–4.0)
MCH: 29.2 pg (ref 26.0–34.0)
MCHC: 32.1 g/dL (ref 30.0–36.0)
MCV: 91 fL (ref 78.0–100.0)
MONO ABS: 0.7 10*3/uL (ref 0.1–1.0)
MONOS PCT: 9 % (ref 3–12)
Neutro Abs: 3.4 10*3/uL (ref 1.7–7.7)
Neutrophils Relative %: 45 % (ref 43–77)
Platelets: 289 10*3/uL (ref 150–400)
RBC: 4.89 MIL/uL (ref 3.87–5.11)
RDW: 13.4 % (ref 11.5–15.5)
WBC: 7.4 10*3/uL (ref 4.0–10.5)

## 2013-08-31 LAB — HCG, SERUM, QUALITATIVE: Preg, Serum: NEGATIVE

## 2013-08-31 LAB — PROTIME-INR
INR: 1.04 (ref 0.00–1.49)
Prothrombin Time: 13.4 seconds (ref 11.6–15.2)

## 2013-08-31 LAB — APTT: aPTT: 30 seconds (ref 24–37)

## 2013-08-31 LAB — ABO/RH: ABO/RH(D): O POS

## 2013-08-31 NOTE — Progress Notes (Signed)
Patient speaks spanish. Interpretor used through [employer] [facility] [phone].

## 2013-08-31 NOTE — Progress Notes (Addendum)
Patient has not had hysterectomy but does not have menses. Had one period when she was 48 yrs old. No reason found by drs. Has had 2 pregnancies. One live child. One preg with twins did not survive.

## 2013-08-31 NOTE — Pre-Procedure Instructions (Addendum)
Whitney [name]  08/31/2013   Your procedure is scheduled on:  09/07/13  Report to [facility] short stay admitting at 1115 AM.  Call this number if you have problems the morning of surgery: [phone]   Remember:   Do not eat food or drink liquids after midnight.   Take these medicines the morning of surgery with A SIP OF WATER: pain med           Take all meds as ordered until day of surgery except as instructed below or per dr       Whitney [name] all herbel meds, nsaids (aleve,naproxen,advil,ibuprofen) 5 days prior to surgery(09/02/13) including vitamins, aspirin folic acid, methotrexate, humira   Do not wear jewelry, make-up or nail polish.  Do not wear lotions, powders, or perfumes. You may wear deodorant.  Do not shave 48 hours prior to surgery. Men may shave face and neck.  Do not bring valuables to the hospital.  [name] is not responsible                  for any belongings or valuables.               Contacts, dentures or bridgework may not be worn into surgery.  Leave suitcase in the car. After surgery it may be brought to your room.  For patients admitted to the hospital, discharge time is determined by your                treatment team.               Patients discharged the day of surgery will not be allowed to drive  home.  Name and phone number of your driver:   Special Instructions:  Special Instructions: [name] - Preparing for Surgery  Before surgery, you can play an important role.  Because skin is not sterile, your skin needs to be as free of germs as possible.  You can reduce the number of germs on you skin by washing with CHG (chlorahexidine gluconate) soap before surgery.  CHG is an antiseptic cleaner which kills germs and bonds with the skin to continue killing germs even after washing.  Please DO NOT use if you have an allergy to CHG or antibacterial soaps.  If your skin becomes reddened/irritated stop using the CHG and inform your nurse when you arrive at Short  Stay.  Do not shave (including legs and underarms) for at least 48 hours prior to the first CHG shower.  You may shave your face.  Please follow these instructions carefully:   1.  Shower with CHG Soap the night before surgery and the morning of Surgery.  2.  If you choose to wash your hair, wash your hair first as usual with your normal shampoo.  3.  After you shampoo, rinse your hair and body thoroughly to remove the Shampoo.  4.  Use CHG as you would any other liquid soap.  You can apply chg directly  to the skin and wash gently with scrungie or a clean washcloth.  5.  Apply the CHG Soap to your body ONLY FROM THE NECK DOWN.  Do not use on open wounds or open sores.  Avoid contact with your eyes ears, mouth and genitals (private parts).  Wash genitals (private parts)       with your normal soap.  6.  Wash thoroughly, paying special attention to the area where your surgery will be performed.  7.  Thoroughly rinse  your body with warm water from the neck down.  8.  DO NOT shower/wash with your normal soap after using and rinsing off the CHG Soap.  9.  Pat yourself dry with a clean towel.            10.  Wear clean pajamas.            11.  Place clean sheets on your bed the night of your first shower and do not sleep with pets.  Day of Surgery  Do not apply any lotions/deodorants the morning of surgery.  Please wear clean clothes to the hospital/surgery center.   Please read over the following fact sheets that you were given: Pain Booklet, Coughing and Deep Breathing, Blood Transfusion Information, MRSA Information and Surgical Site Infection Prevention

## 2013-09-03 NOTE — H&P (Signed)
TOTAL KNEE ADMISSION H&P  Patient is being admitted for right total knee arthroplasty.  Subjective:  Chief Complaint:right knee pain.  HPI: Whitney [name], 48 y.o. female, has a history of pain and functional disability in the right knee due to arthritis and has failed non-surgical conservative treatments for greater than 12 weeks to includeNSAID's and/or analgesics, corticosteriod injections, flexibility and strengthening excercises, supervised PT with diminished ADL's post treatment, use of assistive devices and activity modification.  Onset of symptoms was gradual, starting 3 years ago with gradually worsening course since that time. The patient noted prior procedures on the knee to include  arthroscopy on the right knee(s).  Patient currently rates pain in the right knee(s) at 10 out of 10 with activity. Patient has night pain, worsening of pain with activity and weight bearing, pain that interferes with activities of daily living, pain with passive range of motion and crepitus.  Patient has evidence of subchondral sclerosis, periarticular osteophytes and joint space narrowing by imaging studies. This patient has had scope. There is no active infection.  Patient Active Problem List   Diagnosis Date Noted  . Psoriatic arthritis 04/06/2013   Past Medical History  Diagnosis Date  . Arthritis   . Hypoglycemia   . Hypertension   . Psoriatic arthritis   . Osteoporosis   . Snores     Past Surgical History  Procedure Laterality Date  . Knee surgery    . Joint replacement Left 5/14    knee  . Knee arthroscopy Right 06/08/2013    Procedure: RIGHT ARTHROSCOPY KNEE W/ PARTIAL MEDIAL & LATERAL MENISECTOMY & CHONDROPLASTY;  Surgeon: Velna Ochs, MD;  Location:  SURGERY CENTER;  Service: Orthopedics;  Laterality: Right;    No prescriptions prior to admission   Allergies  Allergen Reactions  . Ibuprofen Hives  . Tramadol Hives  . Hydrocodone Nausea And Vomiting and Rash     History  Substance Use Topics  . Smoking status: Never Smoker   . Smokeless tobacco: Not on file  . Alcohol Use: No    No family history on file.   Review of Systems  Constitutional: Negative.   HENT: Negative.   Eyes: Negative.   Respiratory: Negative.   Cardiovascular: Negative.   Gastrointestinal: Negative.   Genitourinary: Negative.   Musculoskeletal: Positive for joint pain.  Skin: Negative.   Neurological: Negative.   Endo/Heme/Allergies: Negative.   Psychiatric/Behavioral: Negative.     Objective:  Physical Exam  Constitutional: She appears well-developed.  HENT:  Head: Normocephalic.  Eyes: Pupils are equal, round, and reactive to light.  Neck: Normal range of motion.  Cardiovascular: Normal rate.   Respiratory: Effort normal.  GI: Soft.  Musculoskeletal:  Right knee exam: No effusion, but severe pain both joint lines.  Well-healed portals.  Motion 0-1 30.  Crepitation 1+.  motor function normal  Neurological: She is alert.  Skin: Skin is warm.  Psychiatric: She has a normal mood and affect.    Vital signs in last 24 hours:    Labs:   Estimated body mass index is 37.38 kg/(m^2) as calculated from the following:   Height as of 06/08/13: 5\' 2"  (1.575 m).   Weight as of 06/08/13: 92.715 kg (204 lb 6.4 oz).   Imaging Review Plain radiographs demonstrate severe degenerative joint disease of the right knee(s). The overall alignment isneutral. The bone quality appears to be good for age and reported activity level.  Assessment/Plan:  End stage arthritis, right knee   The patient  history, physical examination, clinical judgment of the provider and imaging studies are consistent with end stage degenerative joint disease of the right knee(s) and total knee arthroplasty is deemed medically necessary. The treatment options including medical management, injection therapy arthroscopy and arthroplasty were discussed at length. The risks and benefits of total knee  arthroplasty were presented and reviewed. The risks due to aseptic loosening, infection, stiffness, patella tracking problems, thromboembolic complications and other imponderables were discussed. The patient acknowledged the explanation, agreed to proceed with the plan and consent was signed. Patient is being admitted for inpatient treatment for surgery, pain control, PT, OT, prophylactic antibiotics, VTE prophylaxis, progressive ambulation and ADL's and discharge planning. The patient is planning to be discharged home with home health services

## 2013-09-06 MED ORDER — LACTATED RINGERS IV SOLN
INTRAVENOUS | Status: DC
Start: 2013-09-06 — End: 2013-09-07

## 2013-09-06 MED ORDER — TRANEXAMIC ACID 100 MG/ML IV SOLN
1000.0000 mg | INTRAVENOUS | Status: AC
  Administered 2013-09-07: 1000 mg via INTRAVENOUS
  Filled 2013-09-06: qty 10

## 2013-09-06 MED ORDER — CEFAZOLIN SODIUM-DEXTROSE 2-3 GM-% IV SOLR
2.0000 g | INTRAVENOUS | Status: AC
  Administered 2013-09-07: 2 g via INTRAVENOUS
  Filled 2013-09-06: qty 50

## 2013-09-07 ENCOUNTER — Other Ambulatory Visit: Payer: Self-pay

## 2013-09-07 ENCOUNTER — Encounter (HOSPITAL_COMMUNITY): Payer: Medicare Other | Admitting: Anesthesiology

## 2013-09-07 ENCOUNTER — Inpatient Hospital Stay (HOSPITAL_COMMUNITY)
Admission: RE | Admit: 2013-09-07 | Discharge: 2013-09-10 | DRG: 470 | Disposition: A | Discharge status: Home Health | Payer: Medicare Other | Source: Ambulatory Visit | Attending: Orthopaedic Surgery | Admitting: Orthopaedic Surgery

## 2013-09-07 ENCOUNTER — Encounter (HOSPITAL_COMMUNITY): Payer: Self-pay | Admitting: *Deleted

## 2013-09-07 ENCOUNTER — Inpatient Hospital Stay (HOSPITAL_COMMUNITY): Payer: Medicare Other | Admitting: Anesthesiology

## 2013-09-07 ENCOUNTER — Encounter (HOSPITAL_COMMUNITY)
Admission: RE | Disposition: A | Discharge status: Home Health | Payer: Self-pay | Source: Ambulatory Visit | Attending: Orthopaedic Surgery

## 2013-09-07 DIAGNOSIS — R0789 Other chest pain: Secondary | ICD-10-CM | POA: Diagnosis not present

## 2013-09-07 DIAGNOSIS — M25569 Pain in unspecified knee: Secondary | ICD-10-CM | POA: Diagnosis present

## 2013-09-07 DIAGNOSIS — Z96659 Presence of unspecified artificial knee joint: Secondary | ICD-10-CM

## 2013-09-07 DIAGNOSIS — R269 Unspecified abnormalities of gait and mobility: Secondary | ICD-10-CM

## 2013-09-07 DIAGNOSIS — I10 Essential (primary) hypertension: Secondary | ICD-10-CM | POA: Diagnosis present

## 2013-09-07 DIAGNOSIS — Z6837 Body mass index (BMI) 37.0-37.9, adult: Secondary | ICD-10-CM | POA: Diagnosis not present

## 2013-09-07 DIAGNOSIS — Z79899 Other long term (current) drug therapy: Secondary | ICD-10-CM | POA: Diagnosis not present

## 2013-09-07 DIAGNOSIS — Z7982 Long term (current) use of aspirin: Secondary | ICD-10-CM

## 2013-09-07 DIAGNOSIS — L4050 Arthropathic psoriasis, unspecified: Secondary | ICD-10-CM | POA: Diagnosis present

## 2013-09-07 DIAGNOSIS — M810 Age-related osteoporosis without current pathological fracture: Secondary | ICD-10-CM | POA: Diagnosis present

## 2013-09-07 DIAGNOSIS — M1711 Unilateral primary osteoarthritis, right knee: Secondary | ICD-10-CM | POA: Diagnosis present

## 2013-09-07 DIAGNOSIS — M1710 Unilateral primary osteoarthritis, unspecified knee: Secondary | ICD-10-CM | POA: Diagnosis present

## 2013-09-07 DIAGNOSIS — E669 Obesity, unspecified: Secondary | ICD-10-CM | POA: Diagnosis present

## 2013-09-07 DIAGNOSIS — Z886 Allergy status to analgesic agent status: Secondary | ICD-10-CM | POA: Diagnosis not present

## 2013-09-07 HISTORY — PX: TOTAL KNEE ARTHROPLASTY: SHX125

## 2013-09-07 SURGERY — ARTHROPLASTY, KNEE, TOTAL
Anesthesia: Regional | Site: Knee | Laterality: Right

## 2013-09-07 MED ORDER — LACTATED RINGERS IV SOLN
INTRAVENOUS | Status: DC
  Administered 2013-09-07 (×2): via INTRAVENOUS

## 2013-09-07 MED ORDER — LACTATED RINGERS IV SOLN
INTRAVENOUS | Status: DC
  Administered 2013-09-07: 20:00:00 via INTRAVENOUS

## 2013-09-07 MED ORDER — FENTANYL CITRATE 0.05 MG/ML IJ SOLN
INTRAMUSCULAR | Status: AC
  Administered 2013-09-07: 100 ug
  Filled 2013-09-07: qty 2

## 2013-09-07 MED ORDER — MIDAZOLAM HCL 5 MG/5ML IJ SOLN
INTRAMUSCULAR | Status: DC | PRN
  Administered 2013-09-07: 1 mg via INTRAVENOUS

## 2013-09-07 MED ORDER — SODIUM CHLORIDE 0.9 % IR SOLN
Status: DC | PRN
  Administered 2013-09-07: 3000 mL

## 2013-09-07 MED ORDER — SODIUM CHLORIDE 0.9 % IJ SOLN
INTRAMUSCULAR | Status: DC | PRN
  Administered 2013-09-07: 20 mL via INTRAVENOUS

## 2013-09-07 MED ORDER — METOCLOPRAMIDE HCL 5 MG/ML IJ SOLN
INTRAMUSCULAR | Status: AC
  Administered 2013-09-08: 10 mg via INTRAVENOUS
  Filled 2013-09-07: qty 2

## 2013-09-07 MED ORDER — MIDAZOLAM HCL 2 MG/2ML IJ SOLN
INTRAMUSCULAR | Status: AC
  Administered 2013-09-07: 2 mg
  Filled 2013-09-07: qty 2

## 2013-09-07 MED ORDER — HYDROMORPHONE HCL PF 1 MG/ML IJ SOLN
0.2500 mg | INTRAMUSCULAR | Status: DC | PRN
  Administered 2013-09-07: 0.5 mg via INTRAVENOUS

## 2013-09-07 MED ORDER — ROPIVACAINE HCL 5 MG/ML IJ SOLN
INTRAMUSCULAR | Status: DC | PRN
  Administered 2013-09-07: 12 mL via PERINEURAL

## 2013-09-07 MED ORDER — ADALIMUMAB 40 MG/0.8ML ~~LOC~~ AJKT: 0.8000 mL | AUTO-INJECTOR | SUBCUTANEOUS | Status: DC

## 2013-09-07 MED ORDER — BUPIVACAINE LIPOSOME 1.3 % IJ SUSP
INTRAMUSCULAR | Status: DC | PRN
  Administered 2013-09-07: 20 mL

## 2013-09-07 MED ORDER — BISACODYL 5 MG PO TBEC: 5.0000 mg | DELAYED_RELEASE_TABLET | Freq: Every day | ORAL | Status: DC | PRN

## 2013-09-07 MED ORDER — FOLIC ACID 1 MG PO TABS
1.0000 mg | ORAL_TABLET | Freq: Two times a day (BID) | ORAL | Status: DC
  Administered 2013-09-08 – 2013-09-10 (×5): 1 mg via ORAL
  Filled 2013-09-07 (×7): qty 1

## 2013-09-07 MED ORDER — OXYCODONE HCL 5 MG/5ML PO SOLN: 5.0000 mg | Freq: Once | ORAL | Status: DC | PRN

## 2013-09-07 MED ORDER — PHENYLEPHRINE HCL 10 MG/ML IJ SOLN
INTRAMUSCULAR | Status: DC | PRN
  Administered 2013-09-07: 40 ug via INTRAVENOUS

## 2013-09-07 MED ORDER — METHOCARBAMOL 500 MG PO TABS
500.0000 mg | ORAL_TABLET | Freq: Four times a day (QID) | ORAL | Status: DC | PRN
  Administered 2013-09-07 – 2013-09-10 (×9): 500 mg via ORAL
  Filled 2013-09-07 (×9): qty 1

## 2013-09-07 MED ORDER — CEFAZOLIN SODIUM-DEXTROSE 2-3 GM-% IV SOLR
2.0000 g | Freq: Four times a day (QID) | INTRAVENOUS | Status: AC
  Administered 2013-09-07 – 2013-09-08 (×2): 2 g via INTRAVENOUS
  Filled 2013-09-07 (×2): qty 50

## 2013-09-07 MED ORDER — ONDANSETRON HCL 4 MG/2ML IJ SOLN
INTRAMUSCULAR | Status: AC
  Filled 2013-09-07: qty 2

## 2013-09-07 MED ORDER — PROPOFOL INFUSION 10 MG/ML OPTIME
INTRAVENOUS | Status: DC | PRN
  Administered 2013-09-07: 100 ug/kg/min via INTRAVENOUS

## 2013-09-07 MED ORDER — ACETAMINOPHEN 650 MG RE SUPP: 650.0000 mg | Freq: Four times a day (QID) | RECTAL | Status: DC | PRN

## 2013-09-07 MED ORDER — ONDANSETRON HCL 4 MG PO TABS: 4.0000 mg | ORAL_TABLET | Freq: Four times a day (QID) | ORAL | Status: DC | PRN

## 2013-09-07 MED ORDER — FENTANYL CITRATE 0.05 MG/ML IJ SOLN
INTRAMUSCULAR | Status: AC
  Filled 2013-09-07: qty 5

## 2013-09-07 MED ORDER — ALUM & MAG HYDROXIDE-SIMETH 200-200-20 MG/5ML PO SUSP
30.0000 mL | ORAL | Status: DC | PRN
Start: 2013-09-07 — End: 2013-09-10

## 2013-09-07 MED ORDER — BUPIVACAINE LIPOSOME 1.3 % IJ SUSP
20.0000 mL | INTRAMUSCULAR | Status: DC
Start: 2013-09-07 — End: 2013-09-07
  Filled 2013-09-07: qty 20

## 2013-09-07 MED ORDER — LIDOCAINE HCL (CARDIAC) 20 MG/ML IV SOLN
INTRAVENOUS | Status: DC | PRN
  Administered 2013-09-07: 100 mg via INTRAVENOUS

## 2013-09-07 MED ORDER — METHOTREXATE 2.5 MG PO TABS
20.0000 mg | ORAL_TABLET | ORAL | Status: DC
  Filled 2013-09-07: qty 8

## 2013-09-07 MED ORDER — ACETAMINOPHEN 160 MG/5ML PO SOLN
325.0000 mg | ORAL | Status: DC | PRN
  Filled 2013-09-07: qty 20.3

## 2013-09-07 MED ORDER — CHLORHEXIDINE GLUCONATE 4 % EX LIQD
60.0000 mL | Freq: Once | CUTANEOUS | Status: DC
  Filled 2013-09-07: qty 60

## 2013-09-07 MED ORDER — OXYCODONE HCL 5 MG PO TABS: 5.0000 mg | ORAL_TABLET | Freq: Once | ORAL | Status: DC | PRN

## 2013-09-07 MED ORDER — ONDANSETRON HCL 4 MG/2ML IJ SOLN
4.0000 mg | Freq: Once | INTRAMUSCULAR | Status: AC | PRN
  Administered 2013-09-07: 4 mg via INTRAVENOUS

## 2013-09-07 MED ORDER — MENTHOL 3 MG MT LOZG
1.0000 | LOZENGE | OROMUCOSAL | Status: DC | PRN
Start: 2013-09-07 — End: 2013-09-10

## 2013-09-07 MED ORDER — ACETAMINOPHEN 325 MG PO TABS: 325.0000 mg | ORAL_TABLET | ORAL | Status: DC | PRN

## 2013-09-07 MED ORDER — PROPOFOL INFUSION 10 MG/ML OPTIME: INTRAVENOUS | Status: DC | PRN

## 2013-09-07 MED ORDER — ALUM & MAG HYDROXIDE-SIMETH 200-200-20 MG/5ML PO SUSP: 30.0000 mL | ORAL | Status: DC | PRN

## 2013-09-07 MED ORDER — METOCLOPRAMIDE HCL 5 MG PO TABS: 5.0000 mg | ORAL_TABLET | Freq: Three times a day (TID) | ORAL | Status: DC | PRN

## 2013-09-07 MED ORDER — DIPHENHYDRAMINE HCL 12.5 MG/5ML PO ELIX: 12.5000 mg | ORAL_SOLUTION | ORAL | Status: DC | PRN

## 2013-09-07 MED ORDER — CAPTOPRIL 50 MG PO TABS
50.0000 mg | ORAL_TABLET | Freq: Two times a day (BID) | ORAL | Status: DC
  Administered 2013-09-08 – 2013-09-09 (×2): 50 mg via ORAL
  Filled 2013-09-07 (×7): qty 1

## 2013-09-07 MED ORDER — BUPIVACAINE IN DEXTROSE 0.75-8.25 % IT SOLN
INTRATHECAL | Status: DC | PRN
  Administered 2013-09-07: 1.8 mL via INTRATHECAL

## 2013-09-07 MED ORDER — MIDAZOLAM HCL 2 MG/2ML IJ SOLN
INTRAMUSCULAR | Status: AC
  Filled 2013-09-07: qty 2

## 2013-09-07 MED ORDER — PHENOL 1.4 % MT LIQD: 1.0000 | OROMUCOSAL | Status: DC | PRN

## 2013-09-07 MED ORDER — ONDANSETRON HCL 4 MG/2ML IJ SOLN
INTRAMUSCULAR | Status: DC | PRN
  Administered 2013-09-07: 4 mg via INTRAVENOUS

## 2013-09-07 MED ORDER — OXYCODONE HCL 5 MG PO TABS
10.0000 mg | ORAL_TABLET | ORAL | Status: DC | PRN
  Administered 2013-09-07 – 2013-09-09 (×8): 10 mg via ORAL
  Administered 2013-09-09: 15 mg via ORAL
  Administered 2013-09-09: 10 mg via ORAL
  Administered 2013-09-10: 20 mg via ORAL
  Administered 2013-09-10: 10 mg via ORAL
  Administered 2013-09-10: 20 mg via ORAL
  Filled 2013-09-07: qty 2
  Filled 2013-09-07: qty 3
  Filled 2013-09-07 (×4): qty 2
  Filled 2013-09-07: qty 4
  Filled 2013-09-07 (×5): qty 2
  Filled 2013-09-07: qty 4

## 2013-09-07 MED ORDER — EPHEDRINE SULFATE 50 MG/ML IJ SOLN
INTRAMUSCULAR | Status: DC | PRN
  Administered 2013-09-07 (×2): 5 mg via INTRAVENOUS

## 2013-09-07 MED ORDER — ASPIRIN EC 325 MG PO TBEC
325.0000 mg | DELAYED_RELEASE_TABLET | Freq: Two times a day (BID) | ORAL | Status: DC
  Administered 2013-09-08 – 2013-09-10 (×4): 325 mg via ORAL
  Filled 2013-09-07 (×8): qty 1

## 2013-09-07 MED ORDER — FERROUS SULFATE 325 (65 FE) MG PO TABS
325.0000 mg | ORAL_TABLET | Freq: Two times a day (BID) | ORAL | Status: DC
  Administered 2013-09-10: 325 mg via ORAL
  Filled 2013-09-07 (×8): qty 1

## 2013-09-07 MED ORDER — METHOCARBAMOL 1000 MG/10ML IJ SOLN
500.0000 mg | Freq: Four times a day (QID) | INTRAMUSCULAR | Status: DC | PRN
  Filled 2013-09-07: qty 5

## 2013-09-07 MED ORDER — ACETAMINOPHEN 325 MG PO TABS: 650.0000 mg | ORAL_TABLET | Freq: Four times a day (QID) | ORAL | Status: DC | PRN

## 2013-09-07 MED ORDER — ONDANSETRON HCL 4 MG/2ML IJ SOLN
4.0000 mg | Freq: Four times a day (QID) | INTRAMUSCULAR | Status: DC | PRN
  Administered 2013-09-07: 4 mg via INTRAVENOUS
  Filled 2013-09-07: qty 2

## 2013-09-07 MED ORDER — HYDROMORPHONE HCL PF 1 MG/ML IJ SOLN
0.5000 mg | INTRAMUSCULAR | Status: DC | PRN
  Administered 2013-09-07 – 2013-09-08 (×3): 1 mg via INTRAVENOUS
  Filled 2013-09-07 (×3): qty 1

## 2013-09-07 MED ORDER — HYDROMORPHONE HCL PF 1 MG/ML IJ SOLN
INTRAMUSCULAR | Status: AC
  Filled 2013-09-07: qty 1

## 2013-09-07 MED ORDER — METOCLOPRAMIDE HCL 5 MG/ML IJ SOLN
5.0000 mg | Freq: Three times a day (TID) | INTRAMUSCULAR | Status: DC | PRN
  Administered 2013-09-07 – 2013-09-08 (×2): 10 mg via INTRAVENOUS
  Filled 2013-09-07: qty 2

## 2013-09-07 MED ORDER — DOCUSATE SODIUM 100 MG PO CAPS
100.0000 mg | ORAL_CAPSULE | Freq: Two times a day (BID) | ORAL | Status: DC
  Administered 2013-09-08 – 2013-09-10 (×5): 100 mg via ORAL
  Filled 2013-09-07 (×6): qty 1

## 2013-09-07 SURGICAL SUPPLY — 68 items
BANDAGE ELASTIC 4 VELCRO ST LF (GAUZE/BANDAGES/DRESSINGS) ×3 IMPLANT
BANDAGE ELASTIC 6 VELCRO ST LF (GAUZE/BANDAGES/DRESSINGS) ×3 IMPLANT
BANDAGE ESMARK 6X9 LF (GAUZE/BANDAGES/DRESSINGS) ×1 IMPLANT
BANDAGE GAUZE ELAST BULKY 4 IN (GAUZE/BANDAGES/DRESSINGS) ×6 IMPLANT
BENZOIN TINCTURE PRP APPL 2/3 (GAUZE/BANDAGES/DRESSINGS) ×3 IMPLANT
BLADE SAGITTAL 25.0X1.19X90 (BLADE) ×2 IMPLANT
BLADE SAGITTAL 25.0X1.19X90MM (BLADE) ×1
BLADE SURG ROTATE 9660 (MISCELLANEOUS) IMPLANT
BNDG ELASTIC 6X10 VLCR STRL LF (GAUZE/BANDAGES/DRESSINGS) ×3 IMPLANT
BNDG ESMARK 6X9 LF (GAUZE/BANDAGES/DRESSINGS) ×3
BOWL SMART MIX CTS (DISPOSABLE) ×3 IMPLANT
CAP UPCHARGE REVISION TRAY ×3 IMPLANT
CAPT RP KNEE ×3 IMPLANT
CEMENT HV SMART SET (Cement) ×6 IMPLANT
CLOSURE STERI-STRIP 1/2X4 (GAUZE/BANDAGES/DRESSINGS) ×1
CLSR STERI-STRIP ANTIMIC 1/2X4 (GAUZE/BANDAGES/DRESSINGS) ×2 IMPLANT
COVER SURGICAL LIGHT HANDLE (MISCELLANEOUS) ×3 IMPLANT
CUFF TOURNIQUET SINGLE 34IN LL (TOURNIQUET CUFF) ×3 IMPLANT
CUFF TOURNIQUET SINGLE 44IN (TOURNIQUET CUFF) IMPLANT
DRAPE EXTREMITY T 121X128X90 (DRAPE) ×3 IMPLANT
DRAPE PROXIMA HALF (DRAPES) ×3 IMPLANT
DRAPE U-SHAPE 47X51 STRL (DRAPES) ×3 IMPLANT
DRSG ADAPTIC 3X8 NADH LF (GAUZE/BANDAGES/DRESSINGS) ×3 IMPLANT
DRSG PAD ABDOMINAL 8X10 ST (GAUZE/BANDAGES/DRESSINGS) ×3 IMPLANT
DURAPREP 26ML APPLICATOR (WOUND CARE) ×3 IMPLANT
ELECT REM PT RETURN 9FT ADLT (ELECTROSURGICAL) ×3
ELECTRODE REM PT RTRN 9FT ADLT (ELECTROSURGICAL) ×1 IMPLANT
FACESHIELD WRAPAROUND (MASK) ×6 IMPLANT
GLOVE BIO SURGEON STRL SZ8 (GLOVE) ×6 IMPLANT
GLOVE BIOGEL PI IND STRL 8 (GLOVE) ×2 IMPLANT
GLOVE BIOGEL PI INDICATOR 8 (GLOVE) ×4
GLOVE SS BIOGEL STRL SZ 8 (GLOVE) ×2 IMPLANT
GLOVE SUPERSENSE BIOGEL SZ 8 (GLOVE) ×4
GOWN STRL REUS W/ TWL LRG LVL3 (GOWN DISPOSABLE) ×1 IMPLANT
GOWN STRL REUS W/ TWL XL LVL3 (GOWN DISPOSABLE) ×1 IMPLANT
GOWN STRL REUS W/TWL 2XL LVL3 (GOWN DISPOSABLE) ×3 IMPLANT
GOWN STRL REUS W/TWL LRG LVL3 (GOWN DISPOSABLE) ×2
GOWN STRL REUS W/TWL XL LVL3 (GOWN DISPOSABLE) ×2
HANDPIECE INTERPULSE COAX TIP (DISPOSABLE) ×2
HOOD PEEL AWAY FACE SHEILD DIS (HOOD) ×3 IMPLANT
IMMOBILIZER KNEE 20 (SOFTGOODS) IMPLANT
IMMOBILIZER KNEE 22 UNIV (SOFTGOODS) ×3 IMPLANT
IMMOBILIZER KNEE 24 THIGH 36 (MISCELLANEOUS) IMPLANT
IMMOBILIZER KNEE 24 UNIV (MISCELLANEOUS)
KIT BASIN OR (CUSTOM PROCEDURE TRAY) ×3 IMPLANT
KIT ROOM TURNOVER OR (KITS) ×3 IMPLANT
MANIFOLD NEPTUNE II (INSTRUMENTS) ×3 IMPLANT
NEEDLE HYPO 21X1 ECLIPSE (NEEDLE) ×3 IMPLANT
NS IRRIG 1000ML POUR BTL (IV SOLUTION) ×3 IMPLANT
PACK TOTAL JOINT (CUSTOM PROCEDURE TRAY) ×3 IMPLANT
PAD ABD 8X10 STRL (GAUZE/BANDAGES/DRESSINGS) ×3 IMPLANT
PAD ARMBOARD 7.5X6 YLW CONV (MISCELLANEOUS) ×6 IMPLANT
SET HNDPC FAN SPRY TIP SCT (DISPOSABLE) ×1 IMPLANT
SPONGE GAUZE 4X4 12PLY (GAUZE/BANDAGES/DRESSINGS) ×3 IMPLANT
SPONGE GAUZE 4X4 12PLY STER LF (GAUZE/BANDAGES/DRESSINGS) ×3 IMPLANT
STAPLER VISISTAT 35W (STAPLE) IMPLANT
SUCTION FRAZIER TIP 10 FR DISP (SUCTIONS) IMPLANT
SUT MNCRL AB 3-0 PS2 18 (SUTURE) IMPLANT
SUT VIC AB 0 CT1 27 (SUTURE) ×4
SUT VIC AB 0 CT1 27XBRD ANBCTR (SUTURE) ×2 IMPLANT
SUT VIC AB 2-0 CT1 27 (SUTURE) ×4
SUT VIC AB 2-0 CT1 TAPERPNT 27 (SUTURE) ×2 IMPLANT
SUT VLOC 180 0 24IN GS25 (SUTURE) ×3 IMPLANT
SYR 50ML LL SCALE MARK (SYRINGE) ×3 IMPLANT
TOWEL OR 17X24 6PK STRL BLUE (TOWEL DISPOSABLE) ×3 IMPLANT
TOWEL OR 17X26 10 PK STRL BLUE (TOWEL DISPOSABLE) ×3 IMPLANT
TRAY FOLEY CATH 14FR (SET/KITS/TRAYS/PACK) ×3 IMPLANT
WATER STERILE IRR 1000ML POUR (IV SOLUTION) ×6 IMPLANT

## 2013-09-07 NOTE — Progress Notes (Signed)
Anesthesiology Post-op Note:  Whitney [name]  is a 48 year old female with a history of hypertension and psoriatic arthritis who underwent right total knee replacement by Dr. Jerl Santos. The procedure was done under spinal anesthesia and the surgical and anesthetic course were unremarkable. In the recovery room she complained of a sharp anterior chest pain exacerbated by deep breathing. An ECG was normal. The pain improved greatly with 2 mg of IV Versed. Over the one hour in the recovery room the pain subsided and was barely noticeable except with coughing.  Examination:  Vital signs: Temperature 36 5 blood pressure 112/70 respiratory rate 15 heart rate 66 oxygen saturation 100% on 2 L  Heart: Regular rate and rhythm no murmurs or gallops  Lungs: Clear no wheezing  Impression: Atypical chest pain in 48 year old female with low risk factor profile who is status post total knee replacement. The ECG and is normal and she appears clinically stable. The pain is largely resolved. Will discharge her to [facility] and follow-up in AM.  Kipp Brood, MD

## 2013-09-07 NOTE — Plan of Care (Signed)
Problem: Consults Goal: Diagnosis- Total Joint Replacement Primary Total Knee Right     

## 2013-09-07 NOTE — Progress Notes (Signed)
Patient arrived from PACU via bed.  Patient reporting history received from Quinn Axe, RN from PACU. Patient was able to answer questions with spanish translator concerning admission.  Called both surgical waiting rooms to find patients daughter, but she was not there.

## 2013-09-07 NOTE — Anesthesia Procedure Notes (Signed)
Anesthesia Regional Block:  Adductor canal block  Pre-Anesthetic Checklist: ,, timeout performed, Correct Patient, Correct Site, Correct Laterality, Correct Procedure, Correct Position, site marked, Risks and benefits discussed,  Surgical consent,  Pre-op evaluation,  At surgeon's request and post-op pain management  Laterality: Lower and Right  Prep: chloraprep       Needles:  Injection technique: Single-shot  Needle Type: Echogenic Needle          Additional Needles:  Procedures: ultrasound guided (picture in chart) Adductor canal block Narrative:  Injection made incrementally with aspirations every 5 mL.  Performed by: Personally  Anesthesiologist: Moser  Additional Notes: H+P and labs reviewed, risks and benefits discussed with patient, procedure tolerated well without complications   Spinal  Patient location during procedure: pre-op Staffing Anesthesiologist: MOSER, CHRIS Preanesthetic Checklist Completed: patient identified, surgical consent, pre-op evaluation, timeout performed, IV checked, risks and benefits discussed and monitors and equipment checked Spinal Block Patient position: sitting Prep: site prepped and draped and DuraPrep Patient monitoring: heart rate, cardiac monitor, continuous pulse ox and blood pressure Approach: midline Location: L3-4 Injection technique: single-shot Needle Needle type: Pencan  Needle gauge: 24 G Needle length: 10 cm Assessment Sensory level: T6

## 2013-09-07 NOTE — Anesthesia Preprocedure Evaluation (Signed)
Anesthesia Evaluation  Patient identified by MRN, date of birth, ID band Patient awake    Reviewed: Allergy & Precautions, H&P , NPO status , Patient's Chart, lab work & pertinent test results  History of Anesthesia Complications Negative for: history of anesthetic complications  Airway Mallampati: II TM Distance: >3 FB Neck ROM: Full    Dental  (+) Teeth Intact   Pulmonary neg pulmonary ROS,  breath sounds clear to auscultation        Cardiovascular hypertension, Pt. on medications - angina- Past MI and - CHF - dysrhythmias Rhythm:Regular     Neuro/Psych negative neurological ROS     GI/Hepatic negative GI ROS, Neg liver ROS,   Endo/Other  Morbid obesity  Renal/GU negative Renal ROS     Musculoskeletal   Abdominal   Peds  Hematology negative hematology ROS (+)   Anesthesia Other Findings   Reproductive/Obstetrics                           Anesthesia Physical Anesthesia Plan  ASA: II  Anesthesia Plan: Spinal and Regional   Post-op Pain Management:    Induction:   Airway Management Planned: Natural Airway  Additional Equipment: None  Intra-op Plan:   Post-operative Plan:   Informed Consent: I have reviewed the patients History and Physical, chart, labs and discussed the procedure including the risks, benefits and alternatives for the proposed anesthesia with the patient or authorized representative who has indicated his/her understanding and acceptance.   Dental advisory given  Plan Discussed with: CRNA and Surgeon  Anesthesia Plan Comments:         Anesthesia Quick Evaluation

## 2013-09-07 NOTE — Progress Notes (Signed)
Dr Noreene Larsson at bedside states pt may go to room

## 2013-09-07 NOTE — Progress Notes (Signed)
Receiving report from [facility] as primary

## 2013-09-07 NOTE — Transfer of Care (Signed)
Immediate Anesthesia Transfer of Care Note  Patient: Whitney [name]  Procedure(s) Performed: Procedure(s): RIGHT TOTAL KNEE ARTHROPLASTY (Right)  Patient Location: PACU  Anesthesia Type:MAC, General and GA combined with regional for post-op pain  Level of Consciousness: awake, alert  and oriented  Airway & Oxygen Therapy: Patient Spontanous Breathing and Patient connected to nasal cannula oxygen  Post-op Assessment: Report given to PACU RN and Post -op Vital signs reviewed and stable  Post vital signs: Reviewed and stable  Complications: No apparent anesthesia complications

## 2013-09-07 NOTE — Op Note (Signed)
PREOP DIAGNOSIS: DJD RIGHT KNEE POSTOP DIAGNOSIS: same PROCEDURE: RIGHT TKR ANESTHESIA: Spinal and block ATTENDING SURGEON: DALLDORF,PETER G ASSISTANT: Elodia Florence PA  INDICATIONS FOR PROCEDURE: Whitney [name] is a 48 y.o. female who has struggled for a long time with pain due to degenerative arthritis of the right knee.  The patient has failed many conservative non-operative measures and at this point has pain which limits the ability to sleep and walk.  The patient is offered total knee replacement.  Informed operative consent was obtained after discussion of possible risks of anesthesia, infection, neurovascular injury, DVT, and death.  The importance of the post-operative rehabilitation protocol to optimize result was stressed extensively with the patient.  SUMMARY OF FINDINGS AND PROCEDURE:  Whitney [name] was taken to the operative suite where under the above anesthesia a right knee replacement was performed.  There were advanced degenerative changes and the bone quality was good.  We used the DePuy system and placed size standard femur, 3 MBT tibia, 35 mm all polyethylene patella, and a size 10 mm spacer.  Elodia Florence PA-C assisted throughout and was invaluable to the completion of the case in that he helped retract and maintain exposure while I placed components.  He also helped close thereby minimizing OR time.  The patient was admitted for appropriate post-op care to include perioperative antibiotics and mechanical and pharmacologic measures for DVT prophylaxis.  DESCRIPTION OF PROCEDURE:  Whitney [name] was taken to the operative suite where the above anesthesia was applied.  The patient was positioned supine and prepped and draped in normal sterile fashion.  An appropriate time out was performed.  After the administration of Kefzol pre-op antibiotic the leg was elevated and exsanguinated and a tourniquet inflated. A standard longitudinal incision was made on the anterior knee.  Dissection was  carried down to the extensor mechanism.  All appropriate anti-infective measures were used including the pre-operative antibiotic, betadine impregnated drape, and closed hooded exhaust systems for each member of the surgical team.  A medial parapatellar incision was made in the extensor mechanism and the knee cap flipped and the knee flexed.  Some residual meniscal tissues were removed along with any remaining ACL/PCL tissue.  A guide was placed on the tibia and a flat cut was made on it's superior surface.  An intramedullary guide was placed in the femur and was utilized to make anterior and posterior cuts creating an appropriate flexion gap.  A second intramedullary guide was placed in the femur to make a distal cut properly balancing the knee with an extension gap equal to the flexion gap.  The three bones sized to the above mentioned sizes and the appropriate guides were placed and utilized.  A trial reduction was done and the knee easily came to full extension and the patella tracked well on flexion.  The trial components were removed and all bones were cleaned with pulsatile lavage and then dried thoroughly.  Cement was mixed and was pressurized onto the bones followed by placement of the aforementioned components.  Excess cement was trimmed and pressure was held on the components until the cement had hardened.  The tourniquet was deflated and a small amount of bleeding was controlled with cautery and pressure.  The knee was irrigated thoroughly.  The extensor mechanism was re-approximated with V-loc suture in running fashion.  The knee was flexed and the repair was solid.  The subcutaneous tissues were re-approximated with #0 and #2-0 vicryl and the skin closed with a subcuticular stitch and  steristrips.  A sterile dressing was applied.  Intraoperative fluids, EBL, and tourniquet time can be obtained from anesthesia records.  DISPOSITION:  The patient was taken to recovery room in stable condition and  admitted for appropriate post-op care to include peri-operative antibiotic and DVT prophylaxis with mechanical and pharmacologic measures.  DALLDORF,PETER G 09/07/2013, 2:40 PM

## 2013-09-07 NOTE — Interval H&P Note (Signed)
History and Physical Interval Note:  09/07/2013 12:13 PM  Whitney [name]  has presented today for surgery, with the diagnosis of RIGHT KNEE DEGENERATIVE JOINT DISEASE  The various methods of treatment have been discussed with the patient and family. After consideration of risks, benefits and other options for treatment, the patient has consented to  Procedure(s): RIGHT TOTAL KNEE ARTHROPLASTY (Right) as a surgical intervention .  The patient's history has been reviewed, patient examined, no change in status, stable for surgery.  I have reviewed the patient's chart and labs.  Questions were answered to the patient's satisfaction.     Conny Moening G

## 2013-09-07 NOTE — Progress Notes (Signed)
Utilization review completed.  

## 2013-09-07 NOTE — Progress Notes (Signed)
Orthopedic Tech Progress Note Patient Details:  Whitney [name] [birth year] [id number]  CPM Right Knee CPM Right Knee: On Right Knee Flexion (Degrees): 60 Right Knee Extension (Degrees): 0 Additional Comments: put ohf on bed   Jennye Moccasin 09/07/2013, 4:35 PM

## 2013-09-08 ENCOUNTER — Encounter (HOSPITAL_COMMUNITY): Payer: Self-pay | Admitting: Orthopaedic Surgery

## 2013-09-08 LAB — CBC
HEMATOCRIT: 35 % — AB (ref 36.0–46.0)
Hemoglobin: 11.3 g/dL — ABNORMAL LOW (ref 12.0–15.0)
MCH: 29.3 pg (ref 26.0–34.0)
MCHC: 32.3 g/dL (ref 30.0–36.0)
MCV: 90.7 fL (ref 78.0–100.0)
Platelets: 239 10*3/uL (ref 150–400)
RBC: 3.86 MIL/uL — ABNORMAL LOW (ref 3.87–5.11)
RDW: 13.2 % (ref 11.5–15.5)
WBC: 10.4 10*3/uL (ref 4.0–10.5)

## 2013-09-08 LAB — BASIC METABOLIC PANEL
BUN: 8 mg/dL (ref 6–23)
CHLORIDE: 99 meq/L (ref 96–112)
CO2: 22 meq/L (ref 19–32)
CREATININE: 0.54 mg/dL (ref 0.50–1.10)
Calcium: 8.5 mg/dL (ref 8.4–10.5)
GFR calc non Af Amer: >90 mL/min (ref >90)
Glucose, Bld: 122 mg/dL — ABNORMAL HIGH (ref 70–99)
Potassium: 3.7 mEq/L (ref 3.7–5.3)
Sodium: 137 mEq/L (ref 137–147)

## 2013-09-08 NOTE — Progress Notes (Signed)
Subjective: 1 Day Post-Op Procedure(s) (LRB): RIGHT TOTAL KNEE ARTHROPLASTY (Right)  Activity level:  wbat Diet tolerance:  ok Voiding:  Foley out this morning Patient reports pain as mild.    Objective: Vital signs in last 24 hours: Temp:  [97.7 F (36.5 C)-98.8 F (37.1 C)] 98.5 F (36.9 C) (06/17 0540) Pulse Rate:  [50-88] 88 (06/17 0540) Resp:  [11-24] 18 (06/17 0540) BP: (107-157)/(44-80) 122/72 mmHg (06/17 0540) SpO2:  [98 %-100 %] 99 % (06/17 0540) Weight:  [93.895 kg (207 lb)] 93.895 kg (207 lb) (06/16 1119)  Labs:  Recent Labs  09/08/13 0500  HGB 11.3*    Recent Labs  09/08/13 0500  WBC 10.4  RBC 3.86*  HCT 35.0*  PLT 239    Recent Labs  09/08/13 0500  NA 137  K 3.7  CL 99  CO2 22  BUN 8  CREATININE 0.54  GLUCOSE 122*  CALCIUM 8.5   No results found for this basename: LABPT, INR,  in the last 72 hours  Physical Exam:  Neurologically intact ABD soft Neurovascular intact Sensation intact distally Intact pulses distally Dorsiflexion/Plantar flexion intact Incision: dressing C/D/I No cellulitis present Compartment soft  Assessment/Plan:  1 Day Post-Op Procedure(s) (LRB): RIGHT TOTAL KNEE ARTHROPLASTY (Right) Advance diet Up with therapy D/C IV fluids Plan for discharge tomorrow Discharge home with home health  ASA 325mg  BID x 2 weeks Follow up in office in two weeks Dressing change to mepilex.     Skarlette Lattner, 09/08/2013, 8:10 AM

## 2013-09-08 NOTE — Evaluation (Signed)
Physical Therapy Evaluation Patient Details Name: Whitney [name] MRN: [id number] DOB: July 07, 1965 Today's Date: 09/08/2013   History of Present Illness  s/p Rt TKA   Clinical Impression  Pt is s/p Rt TKA POD#0 resulting in the deficits listed below (see PT Problem List). Pt will benefit from skilled PT to increase their independence and safety with mobility to allow discharge to the venue listed below. Pt limited in session due to nausea and pain. Anticipate good D/C home due to PLOF. Interpreter utilized for communication, no family present.      Follow Up Recommendations Home health PT;Supervision/Assistance - 24 hour    Equipment Recommendations  3in1 (PT)    Recommendations for Other Services OT consult     Precautions / Restrictions Precautions Precautions: Fall;Knee Restrictions Weight Bearing Restrictions: Yes RLE Weight Bearing: Weight bearing as tolerated      Mobility  Bed Mobility               General bed mobility comments: not assessed; pt up in chair and returned to chair   Transfers Overall transfer level: Needs assistance Equipment used: Rolling walker (2 wheeled) Transfers: Sit to/from Stand Sit to Stand: Min guard         General transfer comment: cues for hand placement and sequencing with RW; min guard to steady and for safety  Ambulation/Gait Ambulation/Gait assistance: Min guard Ambulation Distance (Feet): 16 Feet (8 x 2) Assistive device: Rolling walker (2 wheeled) Gait Pattern/deviations: Step-to pattern;Decreased step length - left;Decreased stance time - right;Decreased stride length;Wide base of support;Antalgic;Trunk flexed Gait velocity: very decreased due to pain and nausea  Gait velocity interpretation: Below normal speed for age/gender General Gait Details: visual cues for proper gt sequencing; pt limited due to pain and nausea; required sitting rest break due to nausea   Stairs            Wheelchair Mobility     Modified Rankin (Stroke Patients Only)       Balance Overall balance assessment: Needs assistance         Standing balance support: During functional activity;Bilateral upper extremity supported Standing balance-Leahy Scale: Poor Standing balance comment: relies heavily on RW for bil UE support                             Pertinent Vitals/Pain Reported 8/10 pain; patient repositioned for comfort     Home Living Family/patient expects to be discharged to:: Private residence Living Arrangements: Children;Spouse/significant other Available Help at Discharge: Family;Available 24 hours/day Type of Home: House Home Access: Stairs to enter Entrance Stairs-Rails: None Entrance Stairs-Number of Steps: 1 Home Layout: One level Home Equipment: Walker - 2 wheels      Prior Function Level of Independence: Independent               Hand Dominance        Extremity/Trunk Assessment   Upper Extremity Assessment: Defer to OT evaluation           Lower Extremity Assessment: RLE deficits/detail RLE Deficits / Details: AROM in sitting -5 to 70 degrees; extension limited by pain     Cervical / Trunk Assessment: Normal  Communication   Communication: Prefers language other than English  Cognition Arousal/Alertness: Awake/alert Behavior During Therapy: WFL for tasks assessed/performed Overall Cognitive Status: Within Functional Limits for tasks assessed  General Comments      Exercises Total Joint Exercises Ankle Circles/Pumps: AROM;Strengthening;Both;10 reps;Seated Quad Sets: AROM;Right (x 1 limited by pain and nausea ) Long Arc Quad: Right;Strengthening;5 reps;Seated;Other (comment);AAROM (limited by pain ) Goniometric ROM: see LE assessment       Assessment/Plan    PT Assessment Patient needs continued PT services  PT Diagnosis Difficulty walking;Generalized weakness;Acute pain   PT Problem List Decreased  strength;Decreased range of motion;Decreased activity tolerance;Decreased balance;Decreased mobility;Decreased knowledge of use of DME;Decreased safety awareness;Pain  PT Treatment Interventions DME instruction;Gait training;Therapeutic activities;Therapeutic exercise;Stair training;Functional mobility training;Balance training;Neuromuscular re-education;Patient/family education   PT Goals (Current goals can be found in the Care Plan section) Acute Rehab PT Goals Patient Stated Goal: to go home PT Goal Formulation: With patient Time For Goal Achievement: 09/15/13 Potential to Achieve Goals: Good    Frequency 7X/week   Barriers to discharge        Co-evaluation               End of Session Equipment Utilized During Treatment: Right knee immobilizer;Gait belt Activity Tolerance: Patient limited by fatigue;Other (comment) (limited by nasuea ) Patient left: in chair;with call bell/phone within reach;with nursing/sitter in room Nurse Communication: Mobility status;Other (comment);Precautions;Weight bearing status (nausea meds )         Time: 0071-2197 PT Time Calculation (min): 21 min   Charges:   PT Evaluation $Initial PT Evaluation Tier I: 1 Procedure PT Treatments $Gait Training: 8-22 mins   PT G CodesDonell Sievert, [place]  [phone] 09/08/2013, 2:03 PM

## 2013-09-08 NOTE — Progress Notes (Signed)
Physical Therapy Treatment Patient Details Name: Whitney [name] MRN: [id number] DOB: [birth year] Today's Date: 09/08/2013    History of Present Illness s/p Rt TKA     PT Comments    Pt continues to be very motivated to progress mobility. Grandchildren present to interpret. Pt c/o increased pain in CPM and demo increased pain with Rt LE squeeze, RN notified. Will cont to follow per POC and progress mobility as tolerated.  Follow Up Recommendations  Home health PT;Supervision/Assistance - 24 hour     Equipment Recommendations  3in1 (PT)    Recommendations for Other Services OT consult     Precautions / Restrictions Precautions Precautions: Fall;Knee Precaution Comments: pt given TKA HEP handout  Required Braces or Orthoses: Knee Immobilizer - Right Knee Immobilizer - Right: Other (comment) (not specified by MD) Restrictions Weight Bearing Restrictions: Yes RLE Weight Bearing: Weight bearing as tolerated    Mobility  Bed Mobility Overal bed mobility: Needs Assistance Bed Mobility: Supine to Sit     Supine to sit: Min assist;HOB elevated Sit to supine: Min guard   General bed mobility comments: (A) to advance Rt LE to/off bed; cues for hand placement and sequencing  Transfers Overall transfer level: Needs assistance Equipment used: Rolling walker (2 wheeled) Transfers: Sit to/from Stand Sit to Stand: Min guard         General transfer comment: cues for RW safety and hand placement   Ambulation/Gait Ambulation/Gait assistance: Min guard Ambulation Distance (Feet): 50 Feet Assistive device: Rolling walker (2 wheeled) Gait Pattern/deviations: Step-to pattern;Decreased stance time - right;Decreased step length - left;Wide base of support;Antalgic Gait velocity: decreased due to pain Gait velocity interpretation: Below normal speed for age/gender General Gait Details: visual cues for gt sequencing and safety with RW; multiple standing rest breaks due to fatigue and  pain   Stairs            Wheelchair Mobility    Modified Rankin (Stroke Patients Only)       Balance Overall balance assessment: Needs assistance Sitting-balance support: Feet supported;No upper extremity supported Sitting balance-Leahy Scale: Fair Sitting balance - Comments: sat EOB without UE suppor ~10 min for exercises    Standing balance support: During functional activity;Bilateral upper extremity supported Standing balance-Leahy Scale: Poor Standing balance comment: relies heavily on UEs for support                    Cognition Arousal/Alertness: Awake/alert Behavior During Therapy: WFL for tasks assessed/performed Overall Cognitive Status: Within Functional Limits for tasks assessed                      Exercises Total Joint Exercises Ankle Circles/Pumps: AROM;Strengthening;Both;10 reps;Seated Quad Sets: AROM;Right;10 reps Heel Slides: AAROM;Strengthening;Right;10 reps;Seated Long Arc Quad: AAROM;Strengthening;Right;10 reps;Seated    General Comments        Pertinent Vitals/Pain 8/10; pt premedicated    Home Living Family/patient expects to be discharged to:: Private residence Living Arrangements: Children;Spouse/significant other Available Help at Discharge: Family;Available 24 hours/day Type of Home: House Home Access: Stairs to enter Entrance Stairs-Rails: None Home Layout: One level Home Equipment: Environmental consultant - 2 wheels Additional Comments: no grab bars in bathroom    Prior Function Level of Independence: Independent          PT Goals (current goals can now be found in the care plan section) Acute Rehab PT Goals Patient Stated Goal: reports to go home friday per family PT Goal Formulation: With patient Time For Goal Achievement:  09/15/13 Potential to Achieve Goals: Good Progress towards PT goals: Progressing toward goals    Frequency  7X/week    PT Plan Current plan remains appropriate    Co-evaluation              End of Session Equipment Utilized During Treatment: Right knee immobilizer;Gait belt Activity Tolerance: Patient limited by pain;Patient limited by fatigue Patient left: in bed;with call bell/phone within reach;Other (comment) (sitting EOB with OT in room)     Time: 4982-6415 PT Time Calculation (min): 27 min  Charges:  $Gait Training: 8-22 mins $Therapeutic Exercise: 8-22 mins                    G Codes:      [name],   [phone] 09/08/2013, 5:32 PM

## 2013-09-08 NOTE — Plan of Care (Signed)
Problem: Consults Goal: Diagnosis- Total Joint Replacement Primary Total Knee Right     

## 2013-09-08 NOTE — Evaluation (Signed)
Occupational Therapy Evaluation Patient Details Name: Whitney [name] MRN: [id number] DOB: 06/15/1965 Today's Date: 09/08/2013    History of Present Illness s/p Rt TKA    Clinical Impression   Pt admitted with the above diagnoses and presents with below problem list. Pt will benefit from continued acute OT to address the below listed deficits and maximize independence with basic ADLs prior to d/c home with 24/7 supervision/assistance. PTA pt independent with ADLs; pt currently min guard for LB ADLs, transfers, and functional mobility.      Follow Up Recommendations  Supervision/Assistance - 24 hour;No OT follow up    Equipment Recommendations  3 in 1 bedside comode    Recommendations for Other Services       Precautions / Restrictions Precautions Precautions: Fall;Knee Required Braces or Orthoses: Knee Immobilizer - Right Restrictions Weight Bearing Restrictions: Yes RLE Weight Bearing: Weight bearing as tolerated      Mobility Bed Mobility Overal bed mobility: Needs Assistance Bed Mobility: Sit to Supine       Sit to supine: Min guard   General bed mobility comments: difficulty scooting back EOB with cues given, pt laid down and then corrected position in bed  Transfers Overall transfer level: Needs assistance Equipment used: Rolling walker (2 wheeled) Transfers: Sit to/from Stand Sit to Stand: Min guard         General transfer comment: cues to keep rw close to self    Balance Overall balance assessment: Needs assistance Sitting-balance support: No upper extremity supported;Feet supported Sitting balance-Leahy Scale: Fair     Standing balance support: Bilateral upper extremity supported;During functional activity Standing balance-Leahy Scale: Poor                              ADL Overall ADL's : Needs assistance/impaired Eating/Feeding: Set up;Sitting   Grooming: Set up;Sitting   Upper Body Bathing: Sitting;Supervision/ safety   Lower  Body Bathing: Min guard;Sit to/from stand   Upper Body Dressing : Supervision/safety;Sitting   Lower Body Dressing: Min guard;Sit to/from stand;With adaptive equipment   Toilet Transfer: Min guard;Ambulation;RW (3n1 over toilet)   Toileting- Clothing Manipulation and Hygiene: Min guard;Sit to/from stand   Tub/ Shower Transfer: Min guard;Ambulation;3 in 1;Rolling walker   Functional mobility during ADLs: Min guard;Rolling walker General ADL Comments: Educated on technique and AE for ADLs with knee precautions. Pt had L knee surgery 1 yr prior and is familiar with techniques.     Vision                     Perception     Praxis      Pertinent Vitals/Pain 8/10 pain in RLE. Nursing notified.     Hand Dominance Right   Extremity/Trunk Assessment Upper Extremity Assessment Upper Extremity Assessment: Overall WFL for tasks assessed   Lower Extremity Assessment Lower Extremity Assessment: Defer to PT evaluation   Cervical / Trunk Assessment Cervical / Trunk Assessment: Normal   Communication Communication Communication: Prefers language other than [place];Interpreter utilized   Cognition Arousal/Alertness: Awake/alert Behavior During Therapy: WFL for tasks assessed/performed Overall Cognitive Status: Within Functional Limits for tasks assessed                     General Comments       Exercises       Shoulder Instructions      Home Living Family/patient expects to be discharged to:: Private residence Living Arrangements: Children;Spouse/significant other  Available Help at Discharge: Family;Available 24 hours/day Type of Home: House Home Access: Stairs to enter [employer] of Steps: 1 Entrance Stairs-Rails: None Home Layout: One level     Bathroom Shower/Tub: Chief Strategy Officer: Standard     Home Equipment: Environmental consultant - 2 wheels   Additional Comments: no grab bars in bathroom      Prior Functioning/Environment  Level of Independence: Independent             OT Diagnosis: Acute pain   OT Problem List: Impaired balance (sitting and/or standing);Decreased knowledge of use of DME or AE;Decreased knowledge of precautions;Pain   OT Treatment/Interventions: Self-care/ADL training;Therapeutic exercise;DME and/or AE instruction;Therapeutic activities;Patient/family education;Balance training    OT Goals(Current goals can be found in the care plan section) Acute Rehab OT Goals Patient Stated Goal: not stated OT Goal Formulation: With patient Time For Goal Achievement: 09/15/13 Potential to Achieve Goals: Good ADL Goals Pt Will Perform Lower Body Bathing: with supervision;with adaptive equipment;sit to/from stand Pt Will Perform Lower Body Dressing: with supervision;with adaptive equipment;sit to/from stand Pt Will Transfer to Toilet: with supervision;ambulating (3n1 over toilet) Pt Will Perform Toileting - Clothing Manipulation and hygiene: with supervision;sit to/from stand Pt Will Perform Tub/Shower Transfer: with supervision;ambulating;3 in 1;rolling walker  OT Frequency: Min 2X/week   Barriers to D/C:            Co-evaluation              End of Session Equipment Utilized During Treatment: Gait belt;Rolling walker CPM Right Knee CPM Right Knee: On Right Knee Flexion (Degrees): 60 Right Knee Extension (Degrees): 0 Nurse Communication: Other (comment) (pain level status)  Activity Tolerance: Patient tolerated treatment well;Patient limited by pain Patient left: in bed;with call bell/phone within reach;with family/visitor present   Time: 7867-6720 OT Time Calculation (min): 19 min Charges:  OT General Charges $OT Visit: 1 Procedure OT Evaluation $Initial OT Evaluation Tier I: 1 Procedure OT Treatments $Self Care/Home Management : 8-22 mins G-Codes:    Pilar Grammes 09/20/2013, 4:38 PM

## 2013-09-09 LAB — CBC
HCT: 34.2 % — ABNORMAL LOW (ref 36.0–46.0)
Hemoglobin: 11.2 g/dL — ABNORMAL LOW (ref 12.0–15.0)
MCH: 29.6 pg (ref 26.0–34.0)
MCHC: 32.7 g/dL (ref 30.0–36.0)
MCV: 90.2 fL (ref 78.0–100.0)
PLATELETS: 226 10*3/uL (ref 150–400)
RBC: 3.79 MIL/uL — AB (ref 3.87–5.11)
RDW: 13.2 % (ref 11.5–15.5)
WBC: 11.2 10*3/uL — AB (ref 4.0–10.5)

## 2013-09-09 NOTE — Anesthesia Postprocedure Evaluation (Signed)
  Anesthesia Post-op Note  Patient: Whitney [name]  Procedure(s) Performed: Procedure(s): RIGHT TOTAL KNEE ARTHROPLASTY (Right)  See 09/07/13 progress notes for detailed post-op eval by Dr. Noreene Larsson

## 2013-09-09 NOTE — Progress Notes (Signed)
Anesthesiology Follow-up:  Awake and alert in good spirits. No further chest pain, progressing with Pt.   VS: T- 37.1 BP- 119/55 RR-18 HR-83 O2 sat 95% on RA  Doing well, post-op atypical chest pain with normal ECG now resolved.  Kipp Brood

## 2013-09-09 NOTE — Progress Notes (Signed)
Physical Therapy Treatment Patient Details Name: Tommye Lehenbauer MRN: [id number] DOB: 1966-03-12 Today's Date: 09/09/2013    History of Present Illness s/p Rt TKA     PT Comments    Pt moving better today and able to ascend flight of stairs as she will need to do to get to her bedroom.  Will continue to follow.    Follow Up Recommendations  Home health PT;Supervision/Assistance - 24 hour     Equipment Recommendations  3in1 (PT)    Recommendations for Other Services       Precautions / Restrictions Precautions Precautions: Fall;Knee Required Braces or Orthoses: Knee Immobilizer - Right Knee Immobilizer - Right: Discontinue once straight leg raise with < 10 degree lag Restrictions Weight Bearing Restrictions: Yes RLE Weight Bearing: Weight bearing as tolerated    Mobility  Bed Mobility Overal bed mobility: Needs Assistance Bed Mobility: Sit to Supine     Supine to sit: Supervision;HOB elevated (at [facility] on) Sit to supine: Min assist   General bed mobility comments: A to bring R LE back to bed only.    Transfers Overall transfer level: Needs assistance Equipment used: Rolling walker (2 wheeled) Transfers: Sit to/from Stand Sit to Stand: Supervision         General transfer comment: cues to control descent to sitting.    Ambulation/Gait Ambulation/Gait assistance: Min guard Ambulation Distance (Feet): 80 Feet (and 140) Assistive device: Rolling walker (2 wheeled) Gait Pattern/deviations: Step-to pattern;Decreased step length - left;Decreased stance time - right;Trunk flexed     General Gait Details: cues for upright posture and encouragement   Stairs Stairs: Yes Stairs assistance: Min assist Stair Management: No rails;One rail Left;Forwards;With walker Number of Stairs: 12 (and 1 with RW) General stair comments: pt ed on use of RW with a single step and L rail when ascending flight of stairs.    Wheelchair Mobility    Modified Rankin (Stroke Patients  Only)       Balance                                    Cognition Arousal/Alertness: Awake/alert Behavior During Therapy: WFL for tasks assessed/performed Overall Cognitive Status: Within Functional Limits for tasks assessed                      Exercises Total Joint Exercises Long Arc Quad: AAROM;Right;10 reps Knee Flexion: AROM;Right;10 reps Goniometric ROM: 5-80    General Comments        Pertinent Vitals/Pain Pt indicates pain as moderate.  Premedicated.      Home Living                      Prior Function            PT Goals (current goals can now be found in the care plan section) Acute Rehab PT Goals Time For Goal Achievement: 09/15/13 Potential to Achieve Goals: Good Progress towards PT goals: Progressing toward goals    Frequency  7X/week    PT Plan Current plan remains appropriate    Co-evaluation             End of Session Equipment Utilized During Treatment: Right knee immobilizer;Gait belt Activity Tolerance: Patient tolerated treatment well Patient left: in bed;with call bell/phone within reach     Time: 1740-8144 PT Time Calculation (min): 25 min  Charges:  $Gait Training: 23-37 mins  G CodesSunny Schlein, [place] [phone] 09/09/2013, 12:32 PM

## 2013-09-09 NOTE — Progress Notes (Signed)
Orthopedic Tech Progress Note Patient Details:  [name] 05/19/1965 [id number] On cpm at 8:00 pm RLE 0-50 rn to increase  Patient ID: [name], female   DOB: 01-18-1966, 48 y.o.   MRN: [id number]   Jennye Moccasin 09/09/2013, 8:02 PM

## 2013-09-09 NOTE — Progress Notes (Signed)
Physical Therapy Note  Pt with increased pain this pm, but agreeable to ambulation.  Will continue to follow.     09/09/13 1400  PT Visit Information  Last PT Received On 09/09/13  Assistance Needed +1  History of Present Illness s/p Rt TKA   PT Time Calculation  PT Start Time 1424  PT Stop Time 1440  PT Time Calculation (min) 16 min  Precautions  Precautions Fall;Knee  Required Braces or Orthoses Knee Immobilizer - Right  Knee Immobilizer - Right Discontinue once straight leg raise with < 10 degree lag  Restrictions  Weight Bearing Restrictions Yes  RLE Weight Bearing WBAT  Cognition  Arousal/Alertness Awake/alert  Behavior During Therapy WFL for tasks assessed/performed  Overall Cognitive Status Within Functional Limits for tasks assessed  Bed Mobility  Overal bed mobility Needs Assistance  Bed Mobility Supine to Sit;Sit to Supine  Supine to sit Min assist  Sit to supine Min assist  General bed mobility comments A for R LE only.    Transfers  Overall transfer level Needs assistance  Equipment used Rolling walker (2 wheeled)  Transfers Sit to/from Stand  Sit to Stand Supervision  General transfer comment cues to control descent to sitting.    Ambulation/Gait  Ambulation/Gait assistance Min guard  Ambulation Distance (Feet) 180 Feet  Assistive device Rolling walker (2 wheeled)  Gait Pattern/deviations Step-through pattern;Decreased step length - left;Decreased stance time - right;Decreased stride length  General Gait Details cues for upright posture and encouragement  PT - End of Session  Equipment Utilized During Treatment Right knee immobilizer;Gait belt  Activity Tolerance Patient tolerated treatment well  Patient left in bed;with call bell/phone within reach  Nurse Communication Mobility status  PT - Assessment/Plan  PT Plan Current plan remains appropriate  PT Frequency 7X/week  Follow Up Recommendations Home health PT;Supervision/Assistance - 24 hour  PT  equipment 3in1 (PT)  PT Goal Progression  Progress towards PT goals Progressing toward goals  Acute Rehab PT Goals  Time For Goal Achievement 09/15/13  Potential to Achieve Goals Good  PT General Charges  $$ ACUTE PT VISIT 1 Procedure  PT Treatments  $Gait Training 8-22 mins   [place], PT [phone]

## 2013-09-09 NOTE — Progress Notes (Signed)
Subjective: 2 Days Post-Op Procedure(s) (LRB): RIGHT TOTAL KNEE ARTHROPLASTY (Right)  Activity level:  wbat Diet tolerance:  ok Voiding:  ok Patient reports pain as mild and moderate.    Objective: Vital signs in last 24 hours: Temp:  [98.1 F (36.7 C)-100 F (37.8 C)] 98.8 F (37.1 C) (06/18 0533) Pulse Rate:  [76-84] 83 (06/18 0533) Resp:  [18] 18 (06/18 0533) BP: (87-119)/(48-86) 119/55 mmHg (06/18 1013) SpO2:  [94 %-100 %] 95 % (06/18 0533)  Labs:  Recent Labs  09/08/13 0500 09/09/13 0535  HGB 11.3* 11.2*    Recent Labs  09/08/13 0500 09/09/13 0535  WBC 10.4 11.2*  RBC 3.86* 3.79*  HCT 35.0* 34.2*  PLT 239 226    Recent Labs  09/08/13 0500  NA 137  K 3.7  CL 99  CO2 22  BUN 8  CREATININE 0.54  GLUCOSE 122*  CALCIUM 8.5   No results found for this basename: LABPT, INR,  in the last 72 hours  Physical Exam:  Neurologically intact ABD soft Neurovascular intact Sensation intact distally Intact pulses distally Dorsiflexion/Plantar flexion intact Incision: dressing C/D/I No cellulitis present Compartment soft  Assessment/Plan:  2 Days Post-Op Procedure(s) (LRB): RIGHT TOTAL KNEE ARTHROPLASTY (Right) Advance diet Up with therapy Plan for discharge tomorrow Discharge home with home health Continue on ASA 325mg  BID x 2 weeks Follow up in office in two weeks post op     NIDA, 09/09/2013, 12:25 PM

## 2013-09-09 NOTE — Care Management Note (Signed)
CARE MANAGEMENT NOTE 09/09/2013  Patient:  Whitney [name],Whitney [name]   Account Number:  [id number]  Date Initiated:  09/09/2013  Documentation initiated by:  Vance Peper  Subjective/Objective Assessment:   48 yr old spanish speaking female s/p right total knee arthroplasty.     Action/Plan:   Patient has family support at discharge. Preoperatively setup with [facility], n changes.   Anticipated DC Date:  09/09/2013   Anticipated DC Plan:  HOME W HOME HEALTH SERVICES      DC Planning Services  CM consult      [facility] Choice  HOME HEALTH  DURABLE MEDICAL EQUIPMENT   Choice offered to / List presented to:  C-1 Patient   DME arranged  3-N-1  WALKER - ROLLING  CPM  TUB BENCH      DME agency  [employer]     HH arranged  HH-2 PT      HH agency  [facility]   Status of service:  Completed, signed off Medicare Important Message given?  NA - LOS <3 / Initial given by admissions (If response is "NO", the following Medicare IM given date fields will be blank) Date Medicare IM given:   Date Additional Medicare IM given:    Discharge Disposition:  HOME W HOME HEALTH SERVICES  Per UR Regulation:  Reviewed for med. necessity/level of care/duration of stay  If discussed at Long Length of Stay Meetings, dates discussed:    Comments:

## 2013-09-09 NOTE — Progress Notes (Signed)
Occupational Therapy Treatment and Discharge Patient Details Name: Whitney [name] MRN: [id number] DOB: 1965-03-26 Today's Date: 09/09/2013    History of present illness s/p Rt TKA    OT comments  This 48 yo presents to acute OT today with all education completed and all DME/AE acquired. Acute OT will sign off.  Follow Up Recommendations  No OT follow up;Supervision/Assistance - 24 hour    Equipment Recommendations  3 in 1 bedside comode;Tub/shower bench       Precautions / Restrictions Precautions Precautions: Fall;Knee Required Braces or Orthoses: Knee Immobilizer - Right Knee Immobilizer - Right: Other (comment) (not specifed by MD, cannot straight leg raise as of yet ) Restrictions Weight Bearing Restrictions: No RLE Weight Bearing: Weight bearing as tolerated       Mobility Bed Mobility Overal bed mobility: Needs Assistance Bed Mobility: Supine to Sit     Supine to sit: Supervision;HOB elevated (at [facility] on)        Transfers Overall transfer level: Needs assistance Equipment used: Rolling walker (2 wheeled) Transfers: Sit to/from Stand Sit to Stand: Supervision                  ADL                                   Tub/ Shower Transfer: Ambulation;Tub bench;Supervision/safety   Functional mobility during ADLs: Supervision/safety;Rolling walker General ADL Comments: Pt's son interpreted. Pt feels fine about how to use AE (we issued her a kit) and how to use 3n1                Cognition   Behavior During Therapy: [facility] for tasks assessed/performed Overall Cognitive Status: Within Functional Limits for tasks assessed                                    Pertinent Vitals/ Pain       No c/o pain            Progress Toward Goals  OT Goals(current goals can now be found in the care plan section)  Progress towards OT goals: Goals met/education completed, patient discharged from [place] All goals met and education  completed, patient discharged from [place] During Treatment: Rolling walker;Right knee immobilizer   Activity Tolerance Patient tolerated treatment well   Patient Left in chair;with call bell/phone within reach           Time: 1030-1050 OT Time Calculation (min): 20 min  Charges: OT General Charges $OT Visit: 1 Procedure OT Treatments $Self Care/Home Management : 8-22 mins  Almon Register  [phone]  09/09/2013, 10:57 AM

## 2013-09-10 LAB — CBC
HCT: 34.4 % — ABNORMAL LOW (ref 36.0–46.0)
HEMOGLOBIN: 11.2 g/dL — AB (ref 12.0–15.0)
MCH: 29.9 pg (ref 26.0–34.0)
MCHC: 32.6 g/dL (ref 30.0–36.0)
MCV: 91.7 fL (ref 78.0–100.0)
Platelets: 223 10*3/uL (ref 150–400)
RBC: 3.75 MIL/uL — ABNORMAL LOW (ref 3.87–5.11)
RDW: 13.2 % (ref 11.5–15.5)
WBC: 8.1 10*3/uL (ref 4.0–10.5)

## 2013-09-10 MED ORDER — OXYCODONE-ACETAMINOPHEN 10-325 MG PO TABS: ORAL_TABLET | ORAL | Status: DC

## 2013-09-10 MED ORDER — ASPIRIN 325 MG PO TBEC: 325.0000 mg | DELAYED_RELEASE_TABLET | Freq: Two times a day (BID) | ORAL | Status: AC

## 2013-09-10 MED ORDER — METHOCARBAMOL 500 MG PO TABS: 500.0000 mg | ORAL_TABLET | Freq: Four times a day (QID) | ORAL | Status: DC | PRN

## 2013-09-10 NOTE — Discharge Summary (Signed)
Patient ID: Whitney [name] MRN: [id number] DOB/AGE: Jul 26, 1965 48 y.o.  Admit date: 09/07/2013 Discharge date: 09/10/2013  Admission Diagnoses:  Principal Problem:   Right knee DJD Active Problems:   Obesity, unspecified   Discharge Diagnoses:  Same  Past Medical History  Diagnosis Date  . Arthritis   . Hypoglycemia   . Hypertension   . Psoriatic arthritis   . Osteoporosis   . Snores     Surgeries: Procedure(s): RIGHT TOTAL KNEE ARTHROPLASTY on 09/07/2013   Consultants:    Discharged Condition: Improved  Hospital Course: Whitney [name] is an 48 y.o. female who was admitted 09/07/2013 for operative treatment ofRight knee DJD. Patient has severe unremitting pain that affects sleep, daily activities, and work/hobbies. After pre-op clearance the patient was taken to the operating room on 09/07/2013 and underwent  Procedure(s): RIGHT TOTAL KNEE ARTHROPLASTY.    Patient was given perioperative antibiotics: Anti-infectives   Start     Dose/Rate Route Frequency Ordered Stop   09/07/13 1930  ceFAZolin (ANCEF) IVPB 2 g/50 mL premix     2 g 100 mL/hr over 30 Minutes Intravenous Every 6 hours 09/07/13 1740 09/08/13 0540   09/07/13 0600  ceFAZolin (ANCEF) IVPB 2 g/50 mL premix     2 g 100 mL/hr over 30 Minutes Intravenous On call to O.R. 09/06/13 1403 09/07/13 1311       Patient was given sequential compression devices, early ambulation, and chemoprophylaxis to prevent DVT.  Patient benefited maximally from hospital stay and there were no complications.    Recent vital signs: Patient Vitals for the past 24 hrs:  BP Temp Temp src Pulse Resp SpO2  09/10/13 0559 110/56 mmHg 98.6 F (37 C) - 81 18 99 %  09/09/13 2056 108/51 mmHg 98.7 F (37.1 C) - 87 18 99 %  09/09/13 1551 107/50 mmHg 98.6 F (37 C) Oral 87 18 98 %  09/09/13 1013 119/55 mmHg - - - - -     Recent laboratory studies:  Recent Labs  09/08/13 0500 09/09/13 0535  WBC 10.4 11.2*  HGB 11.3* 11.2*  HCT 35.0*  34.2*  PLT 239 226  NA 137  --   K 3.7  --   CL 99  --   CO2 22  --   BUN 8  --   CREATININE 0.54  --   GLUCOSE 122*  --   CALCIUM 8.5  --      Discharge Medications:     Medication List         aspirin 325 MG EC tablet  Take 1 tablet (325 mg total) by mouth 2 (two) times daily after a meal.     augmented betamethasone dipropionate 0.05 % cream  Commonly known as:  DIPROLENE-AF  Apply 1 application topically daily as needed (psoriasis).     captopril 50 MG tablet  Commonly known as:  CAPOTEN  Take 1 tablet (50 mg total) by mouth 2 (two) times daily.     folic acid 1 MG tablet  Commonly known as:  FOLVITE  Take 1 tablet (1 mg total) by mouth 2 (two) times daily.     HUMIRA PEN 40 MG/0.8ML Pnkt  Generic drug:  Adalimumab  Inject 0.8 mLs as directed every 14 (fourteen) days.     methocarbamol 500 MG tablet  Commonly known as:  ROBAXIN  Take 1 tablet (500 mg total) by mouth every 6 (six) hours as needed for muscle spasms.     methotrexate 2.5 MG tablet  Commonly  known as:  RHEUMATREX  - Take 8 tablets (20 mg total) by mouth once a week. Each Friday  - Caution:Chemotherapy. Protect from light.     methylPREDNISolone 4 MG tablet  Commonly known as:  MEDROL DOSEPAK  Take 4-8 mg by mouth See admin instructions. follow package directions  6 day pack     oxyCODONE-acetaminophen 10-325 MG per tablet  Commonly known as:  PERCOCET  1-2 PO Q 6hrs PRN Pain        Diagnostic Studies: Dg Chest 2 View  08/31/2013   CLINICAL DATA:  48 year old female preoperative study for right knee surgery. Initial encounter.  EXAM: CHEST  2 VIEW  COMPARISON:  None.  FINDINGS: Low lung volumes. Normal cardiac size and mediastinal contours. Visualized tracheal air column is within normal limits. No pneumothorax or pleural effusion. Allowing for crowding of markings, the lungs are clear. No acute osseous abnormality identified.  IMPRESSION: Low lung volumes, otherwise no acute cardiopulmonary  abnormality.   Electronically Signed   By: Augusto Gamble M.D.   On: 08/31/2013 12:51    Disposition: 01-Home or Self Care      Discharge Instructions   Call MD / Call 911    Complete by:  As directed   If you experience chest pain or shortness of breath, CALL 911 and be transported to the hospital emergency room.  If you develope a fever above 101 F, pus (white drainage) or increased drainage or redness at the wound, or calf pain, call your surgeon's office.     Constipation Prevention    Complete by:  As directed   Drink plenty of fluids.  Prune juice may be helpful.  You may use a stool softener, such as Colace (over the counter) 100 mg twice a day.  Use MiraLax (over the counter) for constipation as needed.     Diet - low sodium heart healthy    Complete by:  As directed      Increase activity slowly as tolerated    Complete by:  As directed            Follow-up Information   Follow up with Velna Ochs, MD. Call in 2 weeks.   Specialty:  Orthopedic Surgery   Contact information:   [address] [address] [phone]       Follow up with [facility]. (Someone from [facility] will contact you concerning start date and time for physical therapy.)    Specialty:  Home Health Services   Contact information:   [address] [place] [address] [phone]        Signed: Drema Halon 09/10/2013, 7:47 AM

## 2013-09-10 NOTE — Progress Notes (Signed)
PT Cancellation Note  Patient Details Name: Otie Headlee MRN: [id number] DOB: [birth year]   Cancelled Treatment:     pt states she is going home today and has no further questions for PT and is just waiting for her ride.  If pt does not D/C will f/u tomorrow.     [place], Alison Murray 09/10/2013, 2:31 PM

## 2013-09-10 NOTE — Plan of Care (Signed)
Problem: Consults Goal: Diagnosis- Total Joint Replacement Outcome: Completed/Met Date Met:  09/10/13 Primary Total Knee

## 2013-09-11 ENCOUNTER — Emergency Department (HOSPITAL_COMMUNITY)
Admission: EM | Admit: 2013-09-11 | Discharge: 2013-09-11 | Disposition: A | Discharge status: Home / Self Care | Payer: Medicare Other | Attending: Emergency Medicine | Admitting: Emergency Medicine

## 2013-09-11 ENCOUNTER — Emergency Department (HOSPITAL_COMMUNITY): Payer: Medicare Other

## 2013-09-11 ENCOUNTER — Encounter (HOSPITAL_COMMUNITY): Payer: Self-pay | Admitting: Emergency Medicine

## 2013-09-11 DIAGNOSIS — I10 Essential (primary) hypertension: Secondary | ICD-10-CM | POA: Insufficient documentation

## 2013-09-11 DIAGNOSIS — M129 Arthropathy, unspecified: Secondary | ICD-10-CM | POA: Diagnosis not present

## 2013-09-11 DIAGNOSIS — Z79899 Other long term (current) drug therapy: Secondary | ICD-10-CM | POA: Insufficient documentation

## 2013-09-11 DIAGNOSIS — Z96659 Presence of unspecified artificial knee joint: Secondary | ICD-10-CM | POA: Diagnosis not present

## 2013-09-11 DIAGNOSIS — S99919A Unspecified injury of unspecified ankle, initial encounter: Secondary | ICD-10-CM | POA: Diagnosis present

## 2013-09-11 DIAGNOSIS — Z872 Personal history of diseases of the skin and subcutaneous tissue: Secondary | ICD-10-CM | POA: Insufficient documentation

## 2013-09-11 DIAGNOSIS — Z7982 Long term (current) use of aspirin: Secondary | ICD-10-CM | POA: Insufficient documentation

## 2013-09-11 DIAGNOSIS — Z862 Personal history of diseases of the blood and blood-forming organs and certain disorders involving the immune mechanism: Secondary | ICD-10-CM | POA: Insufficient documentation

## 2013-09-11 DIAGNOSIS — S8990XA Unspecified injury of unspecified lower leg, initial encounter: Secondary | ICD-10-CM | POA: Diagnosis present

## 2013-09-11 DIAGNOSIS — IMO0002 Reserved for concepts with insufficient information to code with codable children: Secondary | ICD-10-CM | POA: Diagnosis not present

## 2013-09-11 DIAGNOSIS — Z9889 Other specified postprocedural states: Secondary | ICD-10-CM | POA: Diagnosis not present

## 2013-09-11 DIAGNOSIS — S8000XA Contusion of unspecified knee, initial encounter: Secondary | ICD-10-CM | POA: Diagnosis not present

## 2013-09-11 DIAGNOSIS — Z8639 Personal history of other endocrine, nutritional and metabolic disease: Secondary | ICD-10-CM | POA: Insufficient documentation

## 2013-09-11 DIAGNOSIS — S8001XA Contusion of right knee, initial encounter: Secondary | ICD-10-CM

## 2013-09-11 MED ORDER — OXYCODONE-ACETAMINOPHEN 5-325 MG PO TABS
2.0000 | ORAL_TABLET | Freq: Once | ORAL | Status: AC
  Administered 2013-09-11: 2 via ORAL
  Filled 2013-09-11: qty 2
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
## 2013-09-11 NOTE — Discharge Instructions (Signed)
Contusión °(Contusion) °Una contusión es un hematoma profundo. Las contusiones son el resultado de una lesión que causa sangrado debajo de la piel. La zona de la contusión puede ponerse azul, morada o amarilla. Las lesiones menores causarán contusiones sin dolor, pero las más graves pueden presentar dolor e inflamación durante un par de semanas.  °CAUSAS  °Generalmente, una contusión se debe a un golpe, un traumatismo o una fuerza directa en una zona del cuerpo. °SÍNTOMAS  °· Hinchazón y enrojecimiento en la zona de la lesión. °· Hematomas en la zona de la lesión. °· Dolor con la palpación y sensibilidad en la zona de la lesión. °· Dolor. °DIAGNÓSTICO  °Se puede establecer el diagnóstico al hacer una historia clínica y un examen físico. Tal vez sea necesario hacer una radiografía, una tomografía computarizada o una resonancia magnética para determinar si hay lesiones asociadas, como fracturas. °TRATAMIENTO  °El tratamiento específico dependerá de la zona del cuerpo donde se produjo la lesión. En general, el mejor tratamiento para una contusión es el reposo, la aplicación de hielo, la elevación de la zona y la aplicación de compresas frías en la zona de la lesión. Para calmar el dolor también podrán recomendarle medicamentos de venta libre. Pregúntele al médico cuál es el mejor tratamiento para su contusión. °INSTRUCCIONES PARA EL CUIDADO EN EL HOGAR  °· Aplique hielo sobre la zona lesionada. °¨ Ponga el hielo en una bolsa plástica. °¨ Colóquese una toalla entre la piel y la bolsa de hielo. °¨ Deje el hielo durante 15 a 20 minutos, 3 a 4 veces por día, o según las indicaciones del médico. °· Utilice los medicamentos de venta libre o recetados para calmar el dolor, el malestar o la fiebre, según se lo indique el médico. El médico podrá indicarle que evite tomar antiinflamatorios (aspirina, ibuprofeno y naproxeno) durante 48 horas ya que estos medicamentos pueden aumentar los hematomas. °· Mantenga la zona de la lesión  en reposo. °· Si es posible, eleve la zona de la lesión para reducir la hinchazón. °SOLICITE ATENCIÓN MÉDICA DE INMEDIATO SI:  °· El hematoma o la hinchazón aumentan. °· Siente dolor que empeora. °· La hinchazón o el dolor no se alivian con los medicamentos. °ASEGÚRESE DE QUE:  °· Comprende estas instrucciones. °· Controlará su afección. °· Recibirá ayuda de inmediato si no mejora o si empeora. °Document Released: 12/19/2004 Document Revised: 03/16/2013 °[employer]® Patient Information ©2015 [employer]. This information is not intended to replace advice given to you by your health care provider. Make sure you discuss any questions you have with your health care provider. ° °
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
## 2013-09-11 NOTE — ED Notes (Signed)
Pt discharged home with all belongings, pt alert oriented and able to pull self up to a sitting/standing position upon discharge. Pt transported home by [facility] due to no walker or knee immobilizer present. Pt verbalizes understanding of discharge instructions.

## 2013-09-11 NOTE — ED Notes (Signed)
MD Plunket at bedside.

## 2013-09-11 NOTE — ED Notes (Signed)
Pt requesting a cab voucher, states she does not have any money for a cab.

## 2013-09-11 NOTE — ED Notes (Signed)
MD Plunket at bedside. 

## 2013-09-11 NOTE — ED Notes (Signed)
[facility] called for transport.  

## 2013-09-11 NOTE — ED Notes (Signed)
Patient transported to X-ray 

## 2013-09-30 ENCOUNTER — Encounter (HOSPITAL_COMMUNITY): Payer: Self-pay | Admitting: Orthopaedic Surgery

## 2013-09-30 NOTE — OR Nursing (Signed)
Late entry on 09-30-2013 by D. Saprina Chuong, RN to add surgery end time. 

## 2013-10-07 ENCOUNTER — Ambulatory Visit: Payer: Medicare Other | Admitting: Family Medicine

## 2013-11-09 DIAGNOSIS — Z0271 Encounter for disability determination: Secondary | ICD-10-CM

## 2014-04-08 ENCOUNTER — Emergency Department (HOSPITAL_COMMUNITY): Payer: Medicare Other

## 2014-04-08 ENCOUNTER — Encounter (HOSPITAL_COMMUNITY): Payer: Self-pay | Admitting: Emergency Medicine

## 2014-04-08 ENCOUNTER — Emergency Department (HOSPITAL_COMMUNITY)
Admission: EM | Admit: 2014-04-08 | Discharge: 2014-04-08 | Disposition: A | Discharge status: Home / Self Care | Payer: Medicare Other | Attending: Emergency Medicine | Admitting: Emergency Medicine

## 2014-04-08 DIAGNOSIS — S0081XA Abrasion of other part of head, initial encounter: Secondary | ICD-10-CM | POA: Diagnosis not present

## 2014-04-08 DIAGNOSIS — Y998 Other external cause status: Secondary | ICD-10-CM | POA: Diagnosis not present

## 2014-04-08 DIAGNOSIS — Y92014 Private driveway to single-family (private) house as the place of occurrence of the external cause: Secondary | ICD-10-CM | POA: Insufficient documentation

## 2014-04-08 DIAGNOSIS — G8929 Other chronic pain: Secondary | ICD-10-CM | POA: Diagnosis not present

## 2014-04-08 DIAGNOSIS — I10 Essential (primary) hypertension: Secondary | ICD-10-CM | POA: Insufficient documentation

## 2014-04-08 DIAGNOSIS — Z7982 Long term (current) use of aspirin: Secondary | ICD-10-CM | POA: Diagnosis not present

## 2014-04-08 DIAGNOSIS — Y9389 Activity, other specified: Secondary | ICD-10-CM | POA: Diagnosis not present

## 2014-04-08 DIAGNOSIS — Z79899 Other long term (current) drug therapy: Secondary | ICD-10-CM | POA: Diagnosis not present

## 2014-04-08 DIAGNOSIS — M1990 Unspecified osteoarthritis, unspecified site: Secondary | ICD-10-CM | POA: Insufficient documentation

## 2014-04-08 DIAGNOSIS — S8991XA Unspecified injury of right lower leg, initial encounter: Secondary | ICD-10-CM | POA: Diagnosis not present

## 2014-04-08 MED ORDER — ONDANSETRON 4 MG PO TBDP
4.0000 mg | ORAL_TABLET | Freq: Once | ORAL | Status: AC
  Administered 2014-04-08: 4 mg via ORAL
  Filled 2014-04-08: qty 1

## 2014-04-08 MED ORDER — ONDANSETRON 4 MG PO TBDP: 4.0000 mg | ORAL_TABLET | Freq: Three times a day (TID) | ORAL | Status: DC | PRN

## 2014-04-08 MED ORDER — OXYCODONE-ACETAMINOPHEN 5-325 MG PO TABS: 1.0000 | ORAL_TABLET | ORAL | Status: DC | PRN

## 2014-04-08 MED ORDER — OXYCODONE-ACETAMINOPHEN 5-325 MG PO TABS
1.0000 | ORAL_TABLET | Freq: Once | ORAL | Status: AC
  Administered 2014-04-08: 1 via ORAL
  Filled 2014-04-08: qty 1

## 2014-04-08 NOTE — ED Provider Notes (Signed)
CSN: [id number]     Arrival date & time 04/08/14  1514 History   First MD Initiated Contact with Patient 04/08/14 1518     Chief Complaint  Patient presents with  . Head Injury    pt slipped and fell onto driveway, striking r/side of forehead  . Knee Pain    struck knee  . Back Pain     (Consider location/radiation/quality/duration/timing/severity/associated sxs/prior Treatment) HPI  Whitney [name] is a(n) 49 y.o. female who presents to the Ed with cc of knee pain and back pain after fall. She has a past surgical history of a right total knee arthroplasty performed by Dr. Freida Busman worse. For about 5 months ago. Patient states that her knee and back Give her chronic pain. She usually walks with a walker. Patient states that today she was getting out of her car from a seated position when her right knee gave way causing her to fall forward onto the knee and hit her head on the pavement. She denies loss of consciousness, change in vision, headache. She does have some nausea currently. She states this is due to her pain. She denies loss of bowel or bladder control, weakness in the legs, recent procedures to her back, fevers. The patient complains of severe burning and throbbing pain in the right knee. She has chronic paresthesia.   Past Medical History  Diagnosis Date  . Arthritis   . Hypoglycemia   . Hypertension   . Psoriatic arthritis   . Osteoporosis   . Snores    Past Surgical History  Procedure Laterality Date  . Knee surgery    . Joint replacement Left 5/14    knee  . Knee arthroscopy Right 06/08/2013    Procedure: RIGHT ARTHROSCOPY KNEE W/ PARTIAL MEDIAL & LATERAL MENISECTOMY & CHONDROPLASTY;  Surgeon: Velna Ochs, MD;  Location: [facility];  Service: Orthopedics;  Laterality: Right;  . Total knee arthroplasty Right 09/07/2013    Procedure: RIGHT TOTAL KNEE ARTHROPLASTY;  Surgeon: Velna Ochs, MD;  Location: [facility];  Service: Orthopedics;  Laterality: Right;    No family history on file. History  Substance Use Topics  . Smoking status: Never Smoker   . Smokeless tobacco: Not on file  . Alcohol Use: No   OB History    No data available     Review of Systems  Ten systems reviewed and are negative for acute change, except as noted in the HPI.    Allergies  Ibuprofen; Tramadol; and Hydrocodone  Home Medications   Prior to Admission medications   Medication Sig Start Date End Date Taking? Authorizing Provider  aspirin EC 325 MG EC tablet Take 1 tablet (325 mg total) by mouth 2 (two) times daily after a meal. 09/10/13   Drema Halon, PA-C  augmented betamethasone dipropionate (DIPROLENE-AF) 0.05 % cream Apply 1 application topically daily as needed (psoriasis). 04/30/13   Carmelina Dane, MD  captopril (CAPOTEN) 50 MG tablet Take 1 tablet (50 mg total) by mouth 2 (two) times daily. 04/30/13   Carmelina Dane, MD  folic acid (FOLVITE) 1 MG tablet Take 1 tablet (1 mg total) by mouth 2 (two) times daily. 04/30/13   Carmelina Dane, MD  HUMIRA PEN 40 MG/0.8ML PNKT Inject 0.8 mLs as directed every 14 (fourteen) days.  08/04/13   Historical Provider, MD  methocarbamol (ROBAXIN) 500 MG tablet Take 1 tablet (500 mg total) by mouth every 6 (six) hours as needed for muscle spasms. 09/10/13  Ginger Organ Nida, PA-C  methotrexate (RHEUMATREX) 2.5 MG tablet Take 8 tablets (20 mg total) by mouth once a week. Each Friday Caution:Chemotherapy. Protect from light. 04/30/13   Carmelina Dane, MD  oxyCODONE-acetaminophen (PERCOCET) 10-325 MG per tablet Take 1-2 tablets by mouth every 6 (six) hours as needed for pain.    Historical Provider, MD  ranitidine (ZANTAC) 300 MG tablet Take 300 mg by mouth daily.    Historical Provider, MD   BP 115/69 mmHg  Pulse 74  Temp(Src) 98.1 F (36.7 C) (Oral)  Resp 22  SpO2 100% Physical Exam  Constitutional: She is oriented to person, place, and time. She appears well-developed and well-nourished. No distress.    HENT:  Head: Normocephalic and atraumatic.    Eyes: Conjunctivae are normal. No scleral icterus.  Neck: Normal range of motion.  Cardiovascular: Normal rate, regular rhythm and normal heart sounds.  Exam reveals no gallop and no friction rub.   No murmur heard. Pulmonary/Chest: Effort normal and breath sounds normal. No respiratory distress.  Abdominal: Soft. Bowel sounds are normal. She exhibits no distension and no mass. There is no tenderness. There is no guarding.  Musculoskeletal:       Legs: Neurological: She is alert and oriented to person, place, and time.  Skin: Skin is warm and dry. She is not diaphoretic.    ED Course  Procedures (including critical care time) Labs Review Labs Reviewed - No data to display  Imaging Review No results found.   EKG Interpretation None      MDM   Final diagnoses:  Knee injury, right, initial encounter    3:41 PM BP 115/69 mmHg  Pulse 74  Temp(Src) 98.1 F (36.7 C) (Oral)  Resp 22  SpO2 100%  The patient given Percocet and Zofran for pain and nausea. Her imaging of the knee is currently pending.   4:38 PM BP 115/69 mmHg  Pulse 74  Temp(Src) 98.1 F (36.7 C) (Oral)  Resp 22  SpO2 100% Patient xray negative for acute abnormality. She has a walker at home and will need to use it . She will need to follow up with her orthopedist for further evaluation   Arthor Captain, PA-C 04/10/14 0101  Purvis Sheffield, MD 04/10/14 1406

## 2014-04-08 NOTE — Discharge Instructions (Signed)
Your xray showed no abnormalites You will need to follow up with Dr. Jerl Santos as soon as possible for further management and evaluation You will need to take your pain medicine and please USE YOUR WALKER AT ALL TIMES!!!! Knee Pain The knee is the complex joint between your thigh and your lower leg. It is made up of bones, tendons, ligaments, and cartilage. The bones that make up the knee are:  The femur in the thigh.  The tibia and fibula in the lower leg.  The patella or kneecap riding in the groove on the lower femur. CAUSES  Knee pain is a common complaint with many causes. A few of these causes are:  Injury, such as:  A ruptured ligament or tendon injury.  Torn cartilage.  Medical conditions, such as:  Gout  Arthritis  Infections  Overuse, over training, or overdoing a physical activity. Knee pain can be minor or severe. Knee pain can accompany debilitating injury. Minor knee problems often respond well to self-care measures or get well on their own. More serious injuries may need medical intervention or even surgery. SYMPTOMS The knee is complex. Symptoms of knee problems can vary widely. Some of the problems are:  Pain with movement and weight bearing.  Swelling and tenderness.  Buckling of the knee.  Inability to straighten or extend your knee.  Your knee locks and you cannot straighten it.  Warmth and redness with pain and fever.  Deformity or dislocation of the kneecap. DIAGNOSIS  Determining what is wrong may be very straight forward such as when there is an injury. It can also be challenging because of the complexity of the knee. Tests to make a diagnosis may include:  Your caregiver taking a history and doing a physical exam.  Routine X-rays can be used to rule out other problems. X-rays will not reveal a cartilage tear. Some injuries of the knee can be diagnosed by:  Arthroscopy a surgical technique by which a small video camera is inserted through  tiny incisions on the sides of the knee. This procedure is used to examine and repair internal knee joint problems. Tiny instruments can be used during arthroscopy to repair the torn knee cartilage (meniscus).  Arthrography is a radiology technique. A contrast liquid is directly injected into the knee joint. Internal structures of the knee joint then become visible on X-ray film.  An MRI scan is a non X-ray radiology procedure in which magnetic fields and a computer produce two- or three-dimensional images of the inside of the knee. Cartilage tears are often visible using an MRI scanner. MRI scans have largely replaced arthrography in diagnosing cartilage tears of the knee.  Blood work.  Examination of the fluid that helps to lubricate the knee joint (synovial fluid). This is done by taking a sample out using a needle and a syringe. TREATMENT The treatment of knee problems depends on the cause. Some of these treatments are:  Depending on the injury, proper casting, splinting, surgery, or physical therapy care will be needed.  Give yourself adequate recovery time. Do not overuse your joints. If you begin to get sore during workout routines, back off. Slow down or do fewer repetitions.  For repetitive activities such as cycling or running, maintain your strength and nutrition.  Alternate muscle groups. For example, if you are a weight lifter, work the upper body on one day and the lower body the next.  Either tight or weak muscles do not give the proper support for your knee.  Tight or weak muscles do not absorb the stress placed on the knee joint. Keep the muscles surrounding the knee strong.  Take care of mechanical problems.  If you have flat feet, orthotics or special shoes may help. See your caregiver if you need help.  Arch supports, sometimes with wedges on the inner or outer aspect of the heel, can help. These can shift pressure away from the side of the knee most bothered by  osteoarthritis.  A brace called an "unloader" brace also may be used to help ease the pressure on the most arthritic side of the knee.  If your caregiver has prescribed crutches, braces, wraps or ice, use as directed. The acronym for this is PRICE. This means protection, rest, ice, compression, and elevation.  Nonsteroidal anti-inflammatory drugs (NSAIDs), can help relieve pain. But if taken immediately after an injury, they may actually increase swelling. Take NSAIDs with food in your stomach. Stop them if you develop stomach problems. Do not take these if you have a history of ulcers, stomach pain, or bleeding from the bowel. Do not take without your caregiver's approval if you have problems with fluid retention, heart failure, or kidney problems.  For ongoing knee problems, physical therapy may be helpful.  Glucosamine and chondroitin are over-the-counter dietary supplements. Both may help relieve the pain of osteoarthritis in the knee. These medicines are different from the usual anti-inflammatory drugs. Glucosamine may decrease the rate of cartilage destruction.  Injections of a corticosteroid drug into your knee joint may help reduce the symptoms of an arthritis flare-up. They may provide pain relief that lasts a few months. You may have to wait a few months between injections. The injections do have a small increased risk of infection, water retention, and elevated blood sugar levels.  Hyaluronic acid injected into damaged joints may ease pain and provide lubrication. These injections may work by reducing inflammation. A series of shots may give relief for as long as 6 months.  Topical painkillers. Applying certain ointments to your skin may help relieve the pain and stiffness of osteoarthritis. Ask your pharmacist for suggestions. Many over the-counter products are approved for temporary relief of arthritis pain.  In some countries, doctors often prescribe topical NSAIDs for relief of  chronic conditions such as arthritis and tendinitis. A review of treatment with NSAID creams found that they worked as well as oral medications but without the serious side effects. PREVENTION  Maintain a healthy weight. Extra pounds put more strain on your joints.  Get strong, stay limber. Weak muscles are a common cause of knee injuries. Stretching is important. Include flexibility exercises in your workouts.  Be smart about exercise. If you have osteoarthritis, chronic knee pain or recurring injuries, you may need to change the way you exercise. This does not mean you have to stop being active. If your knees ache after jogging or playing basketball, consider switching to swimming, water aerobics, or other low-impact activities, at least for a few days a week. Sometimes limiting high-impact activities will provide relief.  Make sure your shoes fit well. Choose footwear that is right for your sport.  Protect your knees. Use the proper gear for knee-sensitive activities. Use kneepads when playing volleyball or laying carpet. Buckle your seat belt every time you drive. Most shattered kneecaps occur in car accidents.  Rest when you are tired. SEEK MEDICAL CARE IF:  You have knee pain that is continual and does not seem to be getting better.  SEEK IMMEDIATE MEDICAL CARE  IF:  Your knee joint feels hot to the touch and you have a high fever. MAKE SURE YOU:   Understand these instructions.  Will watch your condition.  Will get help right away if you are not doing well or get worse. Document Released: 01/06/2007 Document Revised: 06/03/2011 Document Reviewed: 01/06/2007 [facility] Patient Information 2015 [place], [place]. This information is not intended to replace advice given to you by your health care provider. Make sure you discuss any questions you have with your health care provider.

## 2014-04-08 NOTE — ED Notes (Addendum)
Pt stated that she slipped and fell in her driveway, striking r/side of forehead, denies LOC, slight abrasion noted. C/o headache, nausea, denies vomiting C/o r/knee pain, pt reports knee replacement 5 months ago.( Dr. Jerl Santos)  Reports pain in low back. Pt is alert , orient and appropriate. Will use translator to complete assessment

## 2014-04-08 NOTE — ED Notes (Signed)
Per EMS pt spanish only was walking today and twisted right knee with pain 10/10. Pt will not let you touch the area. Pt fell to carpeted floor and has carpet to forehead. Vital WNL. Denies LOC.

## 2014-04-08 NOTE — ED Notes (Signed)
Bed: WTR8 Expected date:  Expected time:  Means of arrival:  Comments: Ems

## 2014-05-01 ENCOUNTER — Emergency Department (HOSPITAL_COMMUNITY): Payer: Medicare Other

## 2014-05-01 ENCOUNTER — Encounter (HOSPITAL_COMMUNITY): Payer: Self-pay | Admitting: Cardiology

## 2014-05-01 ENCOUNTER — Emergency Department (HOSPITAL_COMMUNITY)
Admission: EM | Admit: 2014-05-01 | Discharge: 2014-05-01 | Disposition: A | Discharge status: Home / Self Care | Payer: Medicare Other | Attending: Emergency Medicine | Admitting: Emergency Medicine

## 2014-05-01 DIAGNOSIS — M1990 Unspecified osteoarthritis, unspecified site: Secondary | ICD-10-CM | POA: Insufficient documentation

## 2014-05-01 DIAGNOSIS — S3992XA Unspecified injury of lower back, initial encounter: Secondary | ICD-10-CM | POA: Diagnosis present

## 2014-05-01 DIAGNOSIS — M25561 Pain in right knee: Secondary | ICD-10-CM

## 2014-05-01 DIAGNOSIS — S24109A Unspecified injury at unspecified level of thoracic spinal cord, initial encounter: Secondary | ICD-10-CM | POA: Insufficient documentation

## 2014-05-01 DIAGNOSIS — M545 Low back pain, unspecified: Secondary | ICD-10-CM

## 2014-05-01 DIAGNOSIS — S8991XA Unspecified injury of right lower leg, initial encounter: Secondary | ICD-10-CM | POA: Insufficient documentation

## 2014-05-01 DIAGNOSIS — I10 Essential (primary) hypertension: Secondary | ICD-10-CM | POA: Diagnosis not present

## 2014-05-01 DIAGNOSIS — Z872 Personal history of diseases of the skin and subcutaneous tissue: Secondary | ICD-10-CM | POA: Diagnosis not present

## 2014-05-01 DIAGNOSIS — Z79899 Other long term (current) drug therapy: Secondary | ICD-10-CM | POA: Diagnosis not present

## 2014-05-01 DIAGNOSIS — M549 Dorsalgia, unspecified: Secondary | ICD-10-CM

## 2014-05-01 DIAGNOSIS — Y9389 Activity, other specified: Secondary | ICD-10-CM | POA: Diagnosis not present

## 2014-05-01 DIAGNOSIS — Y998 Other external cause status: Secondary | ICD-10-CM | POA: Diagnosis not present

## 2014-05-01 DIAGNOSIS — Z9889 Other specified postprocedural states: Secondary | ICD-10-CM | POA: Insufficient documentation

## 2014-05-01 DIAGNOSIS — Y9289 Other specified places as the place of occurrence of the external cause: Secondary | ICD-10-CM | POA: Diagnosis not present

## 2014-05-01 DIAGNOSIS — Z8639 Personal history of other endocrine, nutritional and metabolic disease: Secondary | ICD-10-CM | POA: Insufficient documentation

## 2014-05-01 DIAGNOSIS — W108XXA Fall (on) (from) other stairs and steps, initial encounter: Secondary | ICD-10-CM | POA: Diagnosis not present

## 2014-05-01 DIAGNOSIS — Z7982 Long term (current) use of aspirin: Secondary | ICD-10-CM | POA: Insufficient documentation

## 2014-05-01 MED ORDER — OXYCODONE-ACETAMINOPHEN 5-325 MG PO TABS
2.0000 | ORAL_TABLET | Freq: Once | ORAL | Status: AC
  Administered 2014-05-01: 2 via ORAL
  Filled 2014-05-01: qty 2

## 2014-05-01 MED ORDER — OXYCODONE-ACETAMINOPHEN 5-325 MG PO TABS: 1.0000 | ORAL_TABLET | Freq: Four times a day (QID) | ORAL | Status: DC | PRN

## 2014-05-01 NOTE — ED Provider Notes (Signed)
CSN: [id number]     Arrival date & time 05/01/14  1001 History   None    Chief Complaint  Patient presents with  . Back Pain   The history is provided by the patient. A language interpreter was used.    This chart was scribed for non-physician practitioner Santiago Glad, PA-C, working with Rolland Porter, MD, by Andrew Au, ED Scribe. This patient was seen in room TR07C/TR07C and the patient's care was started at 10:06 AM.  Whitney [name] is a 49 y.o. female who presents to the Emergency Department complaining of a fall that occurred 3 hours ago. Pt states her right knee gave out while walking down the stairs causing her to fall and land on her back. Pt denies head impaction or LOC. Pt now presents with stabbing and throbbing back pain and right knee pain. Pt is able to ambulate but has worsening back and knee pain with walking. Pt has not taken pain medication. Pt has an appointment with an orthopaedic surgeon, Dr. Althea Charon, at [facility] in 5 days.  Pt denies numbness, tingling, bladder and bowel incontinence.    Past Medical History  Diagnosis Date  . Arthritis   . Hypoglycemia   . Hypertension   . Psoriatic arthritis   . Osteoporosis   . Snores    Past Surgical History  Procedure Laterality Date  . Knee surgery    . Joint replacement Left 5/14    knee  . Knee arthroscopy Right 06/08/2013    Procedure: RIGHT ARTHROSCOPY KNEE W/ PARTIAL MEDIAL & LATERAL MENISECTOMY & CHONDROPLASTY;  Surgeon: Velna Ochs, MD;  Location: [facility];  Service: Orthopedics;  Laterality: Right;  . Total knee arthroplasty Right 09/07/2013    Procedure: RIGHT TOTAL KNEE ARTHROPLASTY;  Surgeon: Velna Ochs, MD;  Location: [facility] OR;  Service: Orthopedics;  Laterality: Right;   No family history on file. History  Substance Use Topics  . Smoking status: Never Smoker   . Smokeless tobacco: Not on file  . Alcohol Use: No   OB History    No data  available     Review of Systems  Genitourinary: Negative for enuresis and difficulty urinating.  Musculoskeletal: Positive for myalgias and back pain. Negative for gait problem.  Neurological: Negative for syncope, weakness and numbness.   Allergies  Ibuprofen; Tramadol; and Hydrocodone  Home Medications   Prior to Admission medications   Medication Sig Start Date End Date Taking? Authorizing Provider  aspirin EC 325 MG EC tablet Take 1 tablet (325 mg total) by mouth 2 (two) times daily after a meal. 09/10/13   Drema Halon, PA-C  augmented betamethasone dipropionate (DIPROLENE-AF) 0.05 % cream Apply 1 application topically daily as needed (psoriasis). 04/30/13   Carmelina Dane, MD  captopril (CAPOTEN) 50 MG tablet Take 1 tablet (50 mg total) by mouth 2 (two) times daily. 04/30/13   Carmelina Dane, MD  folic acid (FOLVITE) 1 MG tablet Take 1 tablet (1 mg total) by mouth 2 (two) times daily. 04/30/13   Carmelina Dane, MD  HUMIRA PEN 40 MG/0.8ML PNKT Inject 0.8 mLs as directed every 14 (fourteen) days.  08/04/13   Historical Provider, MD  methocarbamol (ROBAXIN) 500 MG tablet Take 1 tablet (500 mg total) by mouth every 6 (six) hours as needed for muscle spasms. 09/10/13   Drema Halon, PA-C  methotrexate (RHEUMATREX) 2.5 MG tablet Take 8 tablets (20 mg total) by mouth once a  week. Each Friday Caution:Chemotherapy. Protect from light. 04/30/13   Carmelina Dane, MD  ondansetron (ZOFRAN ODT) 4 MG disintegrating tablet Take 1 tablet (4 mg total) by mouth every 8 (eight) hours as needed for nausea or vomiting. 04/08/14   Arthor Captain, PA-C  oxyCODONE-acetaminophen (PERCOCET) 5-325 MG per tablet Take 1-2 tablets by mouth every 4 (four) hours as needed. 04/08/14   Arthor Captain, PA-C  ranitidine (ZANTAC) 300 MG tablet Take 300 mg by mouth daily.    Historical Provider, MD   BP 140/75 mmHg  Pulse 85  Temp(Src) 98.1 F (36.7 C) (Oral)  Resp 18  Wt 207 lb (93.895 kg)  SpO2  99% Physical Exam  Constitutional: She is oriented to person, place, and time. She appears well-developed and well-nourished. No distress.  HENT:  Head: Normocephalic and atraumatic.  Eyes: Conjunctivae and EOM are normal.  Neck: Neck supple.  Cardiovascular: Normal rate, regular rhythm and normal heart sounds.   Pulses:      Dorsalis pedis pulses are 2+ on the right side, and 2+ on the left side.  Pulmonary/Chest: Effort normal and breath sounds normal.  Musculoskeletal: Normal range of motion.       Cervical back: She exhibits normal range of motion, no tenderness, no bony tenderness and no deformity.  Pain worse with movement. Diffuse pain of thoracic and lumbar spine. No step off or deformity. Large surgical scar over right knee. TTP medial right knee. Pain with ROM of right knee.    Neurological: She is alert and oriented to person, place, and time.  Distal sensation of feet intact.  Skin: Skin is warm and dry.  Psychiatric: She has a normal mood and affect. Her behavior is normal.  Nursing note and vitals reviewed.   ED Course  Procedures (including critical care time) DIAGNOSTIC STUDIES: Oxygen Saturation is 99% on RA, normal by my interpretation.    COORDINATION OF CARE: 10:41 AM- Pt advised of plan for treatment and pt agrees.  Labs Review Labs Reviewed - No data to display  Imaging Review Dg Thoracic Spine 2 View  05/01/2014   CLINICAL DATA:  Larey Seat down stairs today landing directly on back, now with mid thoracic spine pain radiating inferiorly.  EXAM: THORACIC SPINE - 2 VIEW  COMPARISON:  Chest radiograph - 08/31/2013  FINDINGS: Evaluation of the superior aspect of the thoracic spine is degraded secondary to overlying osseous and soft tissue structures.  Normal alignment of the thoracic spine. No anterolisthesis or retrolisthesis.  Thoracic vertebral body heights appear preserved.  Intervertebral disc space heights appear preserved. Bulky bridging syndesmotic fights with  preservation of disc spaces throughout the mid and caudal aspects of the thoracic spine suggestive of DISH.  Limited visualization adjacent thorax is normal. Regional soft tissues appear normal.  IMPRESSION: No acute findings.   Electronically Signed   By: Simonne Come M.D.   On: 05/01/2014 12:25   Dg Lumbar Spine Complete  05/01/2014   CLINICAL DATA:  Larey Seat down stairs at home earlier today, landing on her back. Right and left-sided lumbar spine pain radiating into both lower extremities. Initial encounter.  EXAM: LUMBAR SPINE - COMPLETE 4+ VIEW  COMPARISON:  None.  FINDINGS: Five non rib-bearing lumbar vertebrae with anatomic alignment. No fractures. Well preserved disc spaces. Endplate hypertrophic changes adjacent to the L1-2, L2-3 and L3-4 discs. Disc space narrowing and endplate hypertrophic changes at T10-11, T11-12 and T12-L1. No pars defects. Right facet degenerative changes at L3-4. Visualized sacroiliac joints intact.  IMPRESSION:  No acute osseous abnormalities. Degenerative changes as detailed above.   Electronically Signed   By: Hulan Saas M.D.   On: 05/01/2014 12:29   Dg Knee Complete 4 Views Right  05/01/2014   CLINICAL DATA:  Patient fell down steps injury to right knee. Pain primarily involving the anterior aspect of the right knee. Post right total knee replacement approximately 5 months ago. Initial encounter.  EXAM: RIGHT KNEE - COMPLETE 4+ VIEW  COMPARISON:  04/09/2014  FINDINGS: Post right total knee replacement. No evidence of hardware failure or loosening. A small displaced ossicle is noted about the superior aspect of the patella, unchanged. No acute fracture. No joint effusion. Regional soft tissues appear normal.  IMPRESSION: 1. No acute findings. 2. Post right total knee replacement without evidence of hardware failure or loosening.   Electronically Signed   By: Simonne Come M.D.   On: 05/01/2014 12:27     EKG Interpretation None      MDM   Final diagnoses:  None    Patient with back pain and knee pain that worsened after a fall that occurred three hours ago.  Xrays negative for acute findings.  No neurological deficits and normal neuro exam.  Patient can walk but states is painful.  No loss of bowel or bladder control.  No concern for cauda equina.  No fever, night sweats, weight loss, h/o cancer, IVDU.  RICE protocol and pain medicine indicated and discussed with patient. Patient instructed to follow up with Orthopedist as scheduled.     I personally performed the services described in this documentation, which was scribed in my presence. The recorded information has been reviewed and is accurate.    Santiago Glad, PA-C 05/02/14 1234  Rolland Porter, MD 05/07/14 2253

## 2014-05-01 NOTE — ED Notes (Addendum)
Pt reports she slipped going down the stairs this morning and hurt the right side of her back.

## 2014-05-25 ENCOUNTER — Emergency Department (HOSPITAL_COMMUNITY)
Admission: EM | Admit: 2014-05-25 | Discharge: 2014-05-25 | Disposition: A | Discharge status: Home / Self Care | Payer: Medicare Other | Attending: Emergency Medicine | Admitting: Emergency Medicine

## 2014-05-25 ENCOUNTER — Encounter (HOSPITAL_COMMUNITY): Payer: Self-pay | Admitting: Emergency Medicine

## 2014-05-25 DIAGNOSIS — I10 Essential (primary) hypertension: Secondary | ICD-10-CM | POA: Insufficient documentation

## 2014-05-25 DIAGNOSIS — Z9181 History of falling: Secondary | ICD-10-CM | POA: Diagnosis not present

## 2014-05-25 DIAGNOSIS — Z872 Personal history of diseases of the skin and subcutaneous tissue: Secondary | ICD-10-CM | POA: Diagnosis not present

## 2014-05-25 DIAGNOSIS — M545 Low back pain: Secondary | ICD-10-CM | POA: Diagnosis present

## 2014-05-25 DIAGNOSIS — M1990 Unspecified osteoarthritis, unspecified site: Secondary | ICD-10-CM | POA: Diagnosis not present

## 2014-05-25 DIAGNOSIS — Z7982 Long term (current) use of aspirin: Secondary | ICD-10-CM | POA: Insufficient documentation

## 2014-05-25 DIAGNOSIS — Z79899 Other long term (current) drug therapy: Secondary | ICD-10-CM | POA: Diagnosis not present

## 2014-05-25 DIAGNOSIS — M5441 Lumbago with sciatica, right side: Secondary | ICD-10-CM

## 2014-05-25 DIAGNOSIS — Z8639 Personal history of other endocrine, nutritional and metabolic disease: Secondary | ICD-10-CM | POA: Insufficient documentation

## 2014-05-25 MED ORDER — OXYCODONE-ACETAMINOPHEN 5-325 MG PO TABS: 1.0000 | ORAL_TABLET | ORAL | Status: DC | PRN

## 2014-05-25 MED ORDER — OXYCODONE-ACETAMINOPHEN 5-325 MG PO TABS
1.0000 | ORAL_TABLET | Freq: Once | ORAL | Status: AC
  Administered 2014-05-25: 1 via ORAL
  Filled 2014-05-25: qty 1

## 2014-05-25 NOTE — ED Notes (Signed)
Patient states worsening back pain since last visit.  Denies injury.  Patient states has a doctor appointment in 3 wks, but couldn't wait.

## 2014-05-25 NOTE — ED Provider Notes (Signed)
CSN: [id number]     Arrival date & time 05/25/14  0917 History  This chart was scribed for Whitney Conroy, PA-C working with No att. providers found by Whitney [name], ED Scribe. This patient was seen in room TR06C/TR06C and the patient's care was started at 10:12 AM.   Chief Complaint  Patient presents with  . Back Pain    The history is provided by the patient. A language interpreter was used.   HPI Comments: Whitney [name] is a 49 y.o. female with PMHx of arthritis, hypoglycemia, hypertension, osteoporosis, and back pain who presents to the Emergency Department complaining of worsening back pain, onset last night. Patient states that her pain was so severe she needed assistance getting out of bed this morning. Patient reports pending orthopedic surgery and states that she has an appointment in three weeks. Patient reports injection treatment, but states that she no longer wishes she receive injections. Patient shares fall injury incurred 05/01/14 for which she was evaluated here. Patient reports that her current pain is more intense than usual. Patient with history of total right knee arthroscopy and left knee joint replacement states. Patient reports MRI; findings include "swelling of her spinal column and two pinched nerves." Patient denies bladder/bowel incontinence or saddle anesthesia.      Past Medical History  Diagnosis Date  . Arthritis   . Hypoglycemia   . Hypertension   . Psoriatic arthritis   . Osteoporosis   . Snores    Past Surgical History  Procedure Laterality Date  . Knee surgery    . Joint replacement Left 5/14    knee  . Knee arthroscopy Right 06/08/2013    Procedure: RIGHT ARTHROSCOPY KNEE W/ PARTIAL MEDIAL & LATERAL MENISECTOMY & CHONDROPLASTY;  Surgeon: Velna Ochs, MD;  [facility];  Service: Orthopedics;  Laterality: Right;  . Total knee arthroplasty Right 09/07/2013    Procedure: RIGHT TOTAL KNEE ARTHROPLASTY;  Surgeon: Velna Ochs, MD;  Location: [facility] OR;  Service: Orthopedics;  Laterality: Right;   No family history on file. History  Substance Use Topics  . Smoking status: Never Smoker   . Smokeless tobacco: Not on file  . Alcohol Use: No   OB History    No data available     Review of Systems  Constitutional: Negative for fever and chills.  Genitourinary: Negative for dysuria and hematuria.  Musculoskeletal: Positive for myalgias and back pain.  Neurological: Negative for weakness and numbness.    Allergies  Ibuprofen; Tramadol; and Hydrocodone  Home Medications   Prior to Admission medications   Medication Sig Start Date End Date Taking? Authorizing Provider  aspirin EC 325 MG EC tablet Take 1 tablet (325 mg total) by mouth 2 (two) times daily after a meal. 09/10/13   Drema Halon, PA-C  augmented betamethasone dipropionate (DIPROLENE-AF) 0.05 % cream Apply 1 application topically daily as needed (psoriasis). 04/30/13   Whitney Dane, MD  captopril (CAPOTEN) 50 MG tablet Take 1 tablet (50 mg total) by mouth 2 (two) times daily. 04/30/13   Whitney Dane, MD  folic acid (FOLVITE) 1 MG tablet Take 1 tablet (1 mg total) by mouth 2 (two) times daily. 04/30/13   Whitney Dane, MD  HUMIRA PEN 40 MG/0.8ML PNKT Inject 0.8 mLs as directed every 14 (fourteen) days.  08/04/13   Historical Provider, MD  methocarbamol (ROBAXIN) 500 MG tablet Take 1 tablet (500 mg total) by mouth every 6 (six) hours as needed for  muscle spasms. 09/10/13   Drema Halon, PA-C  methotrexate (RHEUMATREX) 2.5 MG tablet Take 8 tablets (20 mg total) by mouth once a week. Each Friday Caution:Chemotherapy. Protect from light. 04/30/13   Whitney Dane, MD  ondansetron (ZOFRAN ODT) 4 MG disintegrating tablet Take 1 tablet (4 mg total) by mouth every 8 (eight) hours as needed for nausea or vomiting. 04/08/14   Arthor Captain, PA-C  oxyCODONE-acetaminophen (PERCOCET/ROXICET) 5-325 MG per tablet Take 1-2 tablets by mouth every  4 (four) hours as needed for severe pain. 05/25/14   Whitney Sjogren, PA-C  ranitidine (ZANTAC) 300 MG tablet Take 300 mg by mouth daily.    Historical Provider, MD   Triage Vitals: BP 127/64 mmHg  Pulse 61  Temp(Src) 98.2 F (36.8 C) (Oral)  Resp 18  SpO2 98% Physical Exam  Constitutional: She appears well-developed and well-nourished. No distress.  HENT:  Head: Normocephalic and atraumatic.  Eyes: Conjunctivae are normal. Right eye exhibits no discharge. Left eye exhibits no discharge.  Cardiovascular: Normal rate, regular rhythm and normal heart sounds.   Pulmonary/Chest: Effort normal and breath sounds normal. No respiratory distress. She has no wheezes.  Abdominal: Soft. Bowel sounds are normal. She exhibits no distension. There is no tenderness.  Musculoskeletal:  No midline back tenderness, step off or crepitus. Right sided lower back tenderness. No CVA tenderness.  Neurological: She is alert. Coordination normal.  Equal muscle tone. 5/5 strength in lower extremities. DTR equal and intact. Right positive straight leg test. Left negative straight leg test. Antalgic gait.  Skin: Skin is warm and dry. She is not diaphoretic.  Nursing note and vitals reviewed.   ED Course  Procedures (including critical care time)  COORDINATION OF CARE: 10:22 AM- Patient given return precautions. Plans to prescribe pain medication. Discussed treatment plan with patient at bedside and patient agreed to plan.   Labs Review Labs Reviewed - No data to display  Imaging Review No results found.   EKG Interpretation None      MDM   Final diagnoses:  Right-sided low back pain with right-sided sciatica   Patient presenting with chronic back pain who is on for pain medicines. She is followed by orthopedics and refusing spinal injections requesting pain medicines. She has positive straight leg test indicating likely sciatic involvement but otherwise normal neurological exam. Patient ambulatory  and no loss of control of bladder or bilateral no saddle anesthesia. No concern for cauda equina. Patient stable to follow-up with her orthopedist. Discussed rice protocol and pain medications indicated. Driving and sedation precautions provided.  Discussed return precautions with patient. Discussed all results and patient verbalizes understanding and agrees with plan.  I personally performed the services described in this documentation, which was scribed in my presence. The recorded information has been reviewed and is accurate.   Whitney Sjogren, PA-C 05/25/14 1134  Vanetta Mulders, MD 05/26/14 1734

## 2014-05-25 NOTE — Discharge Instructions (Signed)
Return to the emergency room with worsening of symptoms, new symptoms or with symptoms that are concerning , especially fevers, loss of control of bladder or bowels, numbness or tingling around genital region or anus, weakness. RICE: Rest, Ice (three cycles of 20 mins on, off at least twice a day), compression/brace, elevation. Heating pad works well for back pain. Ibuprofen 400mg  (2 tablets 200mg ) every 5-6 hours for 3-5 days. Percocet. Do not operate machinery, drive or drink alcohol while taking narcotics or muscle relaxers. Follow up with orthopedist if symptoms worsen or are persistent. Read below information and follow recommendations.  Ejercicios para la espalda (Back Exercises) Estos ejercicios ayudan a tratar y prevenir lesiones en la espalda. El objetivo es aumentar la fuerza de los msculos abdominales y dorsales y la flexibilidad de la espalda. Debe comenzar con estos ejercicios cuando ya no tenga dolor. Los ejercicios para la espalda incluyen:  Inclinacin de la pelvis - Recustese sobre la espalda con las rodillas flexionadas. Incline la pelvis hasta que la parte inferior de la espalda se apoye en el piso. Mantenga esta posicin durante 5 a 10 segundos y repita entre 5 y 10 veces.  Rodilla al pecho - Empuje primero una rodilla contra el pecho y [place] 20 a 30 segundos; repita con la otra rodilla y luego con ambas a la vez. Esto puede realizarlo con la otra pierna extendida o flexionada, del modo en que se sienta ms cmodo.  Abdominales o despegar el cccix del suelo empleando la musculatura abdominal - Flexione las rodillas 90 grados. Comience inclinando la pelvis y realice un ejercicio abdominal lento y parcial, elevando el tronco slo entre 30 y 45 grados del suelo. Emplee al 2 y 3 segundos para cada abdominal. No realice los abdominales con las rodillas extendidas. Si le resulta difcil realizar abdominales parciales, simplemente haga lo que se explic  anteriormente, pero slo contraiga los msculos abdominales y [place] tal como se le ha indicado.  Inclinacin de la cadera - Recustese sobre la espalda con las rodillas flexionadas a 90 grados. Empjese con los pies y los hombros mientras eleva la cadera un par de centmetros del suelo, [place] durante 10 segundos y repita entre 5 y 10 veces.  Arcos dorsales - Acustese sobre el [place] e impulse el tronco hacia atrs sobre los codos flexionados. Presione lentamente con las manos, formando un arco con la zona inferior de la espalda. Repita entre 3 y 5 veces. Al realizar las repeticiones, luego de un tiempo disminuirn la rigidez y las [place].  Elevacin de los hombros - Acustese hacia abajo con los brazos a los lados del cuerpo. Presione las caderas y [place] torso contra el suelo mientras eleva lentamente la cabeza y los hombros del suelo. No exagere con los ejercicios, especialmente en el comienzo. Los ejercicios pueden causar alguna molestia leve en la espalda durante algunos minutos; sin embargo, si el dolor es muy intenso, o dura ms de 15 minutos, no siga con la actividad fsica hasta que consulte al profesional que lo asiste. Los problemas en la espalda mejoran de [place] lenta con esta terapia.  Consulte al profesional para que lo ayude a planificar un programa de ejercicios adecuado para su espalda. Document Released: 03/11/2005 Document Revised: 06/03/2011 [facility] Patient Information 2015 [place], [facility]. This information is not intended to replace advice given to you by your health care provider. Make sure you discuss any questions you have with your health care provider.

## 2014-05-25 NOTE — ED Notes (Signed)
Declined W/C at D/C and was escorted to lobby by RN. 

## 2014-05-25 NOTE — ED Notes (Signed)
Patient states out of her percocet 10's.

## 2014-06-27 ENCOUNTER — Emergency Department (HOSPITAL_COMMUNITY)
Admission: EM | Admit: 2014-06-27 | Discharge: 2014-06-27 | Disposition: A | Discharge status: Home / Self Care | Payer: Medicare Other | Attending: Emergency Medicine | Admitting: Emergency Medicine

## 2014-06-27 ENCOUNTER — Encounter (HOSPITAL_COMMUNITY): Payer: Self-pay

## 2014-06-27 DIAGNOSIS — I10 Essential (primary) hypertension: Secondary | ICD-10-CM | POA: Diagnosis not present

## 2014-06-27 DIAGNOSIS — Z7982 Long term (current) use of aspirin: Secondary | ICD-10-CM | POA: Diagnosis not present

## 2014-06-27 DIAGNOSIS — M549 Dorsalgia, unspecified: Secondary | ICD-10-CM | POA: Insufficient documentation

## 2014-06-27 DIAGNOSIS — Z8639 Personal history of other endocrine, nutritional and metabolic disease: Secondary | ICD-10-CM | POA: Diagnosis not present

## 2014-06-27 DIAGNOSIS — Z872 Personal history of diseases of the skin and subcutaneous tissue: Secondary | ICD-10-CM | POA: Insufficient documentation

## 2014-06-27 DIAGNOSIS — G8929 Other chronic pain: Secondary | ICD-10-CM | POA: Diagnosis not present

## 2014-06-27 DIAGNOSIS — Z79899 Other long term (current) drug therapy: Secondary | ICD-10-CM | POA: Diagnosis not present

## 2014-06-27 MED ORDER — MORPHINE SULFATE 4 MG/ML IJ SOLN: 4.0000 mg | Freq: Once | INTRAMUSCULAR | Status: DC

## 2014-06-27 MED ORDER — OXYCODONE-ACETAMINOPHEN 5-325 MG PO TABS
2.0000 | ORAL_TABLET | Freq: Once | ORAL | Status: AC
  Administered 2014-06-27: 2 via ORAL
  Filled 2014-06-27: qty 2

## 2014-06-27 NOTE — ED Notes (Signed)
Pt refused morphine due to previous vomiting side effect.  States she is unable to reach daughter to arrange a ride home.

## 2014-06-27 NOTE — ED Provider Notes (Signed)
CSN: [id number]     Arrival date & time 06/27/14  1900 History  This chart was scribed for non-physician practitioner, Oswaldo Conroy, PA-C, working with Gilda Crease, MD, by Modena Jansky, ED Scribe. This patient was seen in room TR07C/TR07C and the patient's care was started at 8:03 PM.   Chief Complaint  Patient presents with  . Back Pain   The history is provided by the patient. A language interpreter was used.   HPI Comments: Whitney [name] is a 49 y.o. female who presents to the Emergency Department complaining of constant moderate generalized back pain that started today. She states that she has pain at the top of her back that radiates all the way down her LE. She reports that she has been having back pain that she was supposed to get surgery for, and the pain worsened today. She states that she did not get the surgery out of fear. She describes the pain as a aching sensation. She states that movement exacerbates the pain. She reports that she has been taking percocet, a humira injection yesterday, and other medications with temporary relief. She reports that she has some mild weakness and numbness and tingling in her LE. She states that she had a fall about a month ago. She reports a hx of pinch nerve and having a right knee replacement 6 months ago. She denies any fever, chills, bowel or bowel incontinence, or numbness in the genital or anal region. Patient's surgeon stated he would no longer provide narcotic scripts that she needed to follow-up with chronic pain. She has not had an appointment yet.  Past Medical History  Diagnosis Date  . Arthritis   . Hypoglycemia   . Hypertension   . Psoriatic arthritis   . Osteoporosis   . Snores    Past Surgical History  Procedure Laterality Date  . Knee surgery    . Joint replacement Left 5/14    knee  . Knee arthroscopy Right 06/08/2013    Procedure: RIGHT ARTHROSCOPY KNEE W/ PARTIAL MEDIAL & LATERAL MENISECTOMY & CHONDROPLASTY;   Surgeon: Velna Ochs, MD;  Location: [facility];  Service: Orthopedics;  Laterality: Right;  . Total knee arthroplasty Right 09/07/2013    Procedure: RIGHT TOTAL KNEE ARTHROPLASTY;  Surgeon: Velna Ochs, MD;  Location: [facility] OR;  Service: Orthopedics;  Laterality: Right;   No family history on file. History  Substance Use Topics  . Smoking status: Never Smoker   . Smokeless tobacco: Not on file  . Alcohol Use: No   OB History    No data available     Review of Systems  Constitutional: Negative for fever and chills.  Musculoskeletal: Positive for myalgias and back pain.  Neurological: Positive for weakness and numbness.    Allergies  Ibuprofen; Tramadol; and Hydrocodone  Home Medications   Prior to Admission medications   Medication Sig Start Date End Date Taking? Authorizing Provider  aspirin EC 325 MG EC tablet Take 1 tablet (325 mg total) by mouth 2 (two) times daily after a meal. 09/10/13   Elodia Florence, PA-C  augmented betamethasone dipropionate (DIPROLENE-AF) 0.05 % cream Apply 1 application topically daily as needed (psoriasis). 04/30/13   Carmelina Dane, MD  captopril (CAPOTEN) 50 MG tablet Take 1 tablet (50 mg total) by mouth 2 (two) times daily. 04/30/13   Carmelina Dane, MD  folic acid (FOLVITE) 1 MG tablet Take 1 tablet (1 mg total) by mouth 2 (two) times daily. 04/30/13  Carmelina Dane, MD  HUMIRA PEN 40 MG/0.8ML PNKT Inject 0.8 mLs as directed every 14 (fourteen) days.  08/04/13   Historical Provider, MD  methocarbamol (ROBAXIN) 500 MG tablet Take 1 tablet (500 mg total) by mouth every 6 (six) hours as needed for muscle spasms. 09/10/13   Elodia Florence, PA-C  methotrexate (RHEUMATREX) 2.5 MG tablet Take 8 tablets (20 mg total) by mouth once a week. Each Friday Caution:Chemotherapy. Protect from light. 04/30/13   Carmelina Dane, MD  ondansetron (ZOFRAN ODT) 4 MG disintegrating tablet Take 1 tablet (4 mg total) by mouth every 8 (eight) hours as  needed for nausea or vomiting. 04/08/14   Arthor Captain, PA-C  oxyCODONE-acetaminophen (PERCOCET/ROXICET) 5-325 MG per tablet Take 1-2 tablets by mouth every 4 (four) hours as needed for severe pain. 05/25/14   Oswaldo Conroy, PA-C  ranitidine (ZANTAC) 300 MG tablet Take 300 mg by mouth daily.    Historical Provider, MD   BP 118/73 mmHg  Pulse 69  Resp 16  SpO2 96% Physical Exam  Constitutional: She appears well-developed and well-nourished. No distress.  HENT:  Head: Normocephalic and atraumatic.  Eyes: Conjunctivae are normal. Right eye exhibits no discharge. Left eye exhibits no discharge.  Cardiovascular: Normal rate, regular rhythm and normal heart sounds.   Pulmonary/Chest: Effort normal and breath sounds normal. No respiratory distress. She has no wheezes.  Abdominal: Soft. Bowel sounds are normal. She exhibits no distension. There is no tenderness.  Musculoskeletal:  No midline back tenderness, step off or crepitus. Right sided lower back tenderness. No CVA tenderness.    Neurological: She is alert. Coordination normal.  Equal muscle tone. 5/5 strength in lower extremities. DTR equal and intact. Positive right straight leg test. Antalgic gait.   Skin: Skin is warm and dry. She is not diaphoretic.  Nursing note and vitals reviewed.   ED Course  Procedures (including critical care time) DIAGNOSTIC STUDIES: Oxygen Saturation is 96% on RA, normal by my interpretation.    COORDINATION OF CARE: 8:07 PM- Pt advised of plan for treatment which includes medication and pt agrees.  Labs Review Labs Reviewed - No data to display  Imaging Review No results found.   EKG Interpretation None      MDM   Final diagnoses:  Chronic back pain   Patient presenting with chronic back pain with no new injury or fall. VSS. Neurological exam intact except for positive right straight leg test. Patient with element of sciatica. She is ambulatory. I doubt cauda equina. Patient given oral  Percocet in ED but no further narcotic scripts. Patient instructed to follow-up with either her surgeon or pain management for further pain medications.  Discussed return precautions with patient. Discussed all results and patient verbalizes understanding and agrees with plan.  I personally performed the services described in this documentation, which was scribed in my presence. The recorded information has been reviewed and is accurate.   Oswaldo Conroy, PA-C 06/28/14 0154  Gilda Crease, MD 06/28/14 [phone]

## 2014-06-27 NOTE — Discharge Instructions (Signed)
Return to the emergency room with worsening of symptoms, new symptoms or with symptoms that are concerning , especially fevers, loss of control of bladder or bowels, numbness or tingling around genital region or anus, weakness. RICE: Rest, Ice (three cycles of 20 mins on, 67mns off at least twice a day), compression/brace, elevation. Heating pad works well for back pain. Follow up with PCP/orthopedist as soon as possible. Follow up with chronic pain. Read below information and follow recommendations.   Prevencin de las lesiones en la espalda (Back Injury Prevention) Las lesiones en la espalda pueden ser muy dolorosas y son difciles de curar. Despus de tener una lesin en la espalda es probable que sufra otra en el futuro. Es importante aprender a eAir traffic controllero a vLocation managera lEngineer, maintenance (IT) Los siguientes consejos pueden ayudar a prevenir una lesin de espalda.  APTITUD FSICA   Practique actividad fsica con frecuencia y trate de lograr un buen tono en los msculos abdominales. Los msculos del abdomen nos proporcionan el soporte necesario para la espalda.  Haga ejercicios aerbicos (caminar, trotar, andar en bicicleta, nadar) con frecuencia.  Haga ejercicios que aumenten el equilibrio y la fuerza (tai chi, yoga) con frecuencia. Esto puede disminuir el riesgo de caerse y lBuilding surveyorespalda.  Elongue antes y despus de hacer e[place]  Mantenga un peso saludable. Cuanto ms pese, ms tensin se pone en la espalda. Por cada libra de pGrand Blanc es 10 veces mayor la cantidad de presin que se coloca en la parte posterior. DIETA   Consulte con su mdico la cantidad de calcio y vitamina D que necesita por da Estos nutrientes ayudan a prevenir el debilitamiento de los huesos (osteoporosis). La osteoporosis puede quebrar los huesos (fractura) que hacen doler la espalda.  Agregue una buena fuente de calcio en la dieta, como productos lcteos, vegetales de hojas verdes y productos con  calcio agregado (fortificados).  Agregue una buena fuente de vitamina D en su dieta, como la lComstock Parky alimentos que estn fortificados con vitamina D.  Consulte por un suplemento nutricional o un multivitamnico, si es necesario.  Si fuma, abandone el hbito. POSTURA   Sintese y pngase de pie en posicin recta. Evite inclinarse hacia adelante al sentarse o encorvarse mientras est de pie.  Elija sillas con buen apoyo para la espalda (lumbar).  Si trabaja en un escritorio, sintese cerca del mismo, de modo que no deba inclinarse hacia adelante. Mantenga el mCardinal Health El cuello debe estar hacia atrs y los codos doblados en ngulo recto. Los brazos deben formar la letra "L".  Sintese derecho y cerca del volante cuando conduzca su automvil. Coloque un soporte lumbar en el asiento de su automvil, si lo necesita.  Evite permanecer sentado o de pie en la misma posicin por mThe [employer] Financial Descanse, levntese, estrese y camine al menos una vez cada hora. Tome descansos si conduce el automvil por mThe [employer]NC Financial  Duerma sobre un costado con las rodillas ligeramente dobladas o sobre la espalda con una almohada debajo de las rodillas. No duerma sobre el abdomen. LEVANTAR, DOBLARSE Y ALCANZAR   Evite levantar objetos pesados, especialmente si debe repetir los movimientos. Si tiene que levantar un objeto pesado:  Haga elongaciones antes de lLexicographerun objeto.  Trabaje lentamente.  Descanse entre uno y otro esfuerzo.  Use carretas y carretillas para mover objetos siempre que pueda.  Haga varios viajes pequeos en vez de llevar una carga pesada.  Pida ayuda cuando la necesite.  Pida ayuda al  mover objetos grandes y que sean difciles de Air cabin crew.  Siga estos pasos al levantar objetos:  Prese con los pies separados a la misma distancia que el ancho de los hombros.  Mantngase lo ms cerca que pueda del [place]. No intente levantar objetos pesados que se encuentren lejos de su  cuerpo.  Use los mangos o las correas de elevacin, si estn disponibles.  Doble las rodillas. Pngase de cuclillas, pero mantenga los [employer].  Mantenga los hombros hacia atrs, el mentn pegado, y la espalda recta.  Levante el objeto lentamente, tensando los msculos de las piernas, el abdomen y las nalgas. Mantenga el objeto lo ms cerca del centro de su cuerpo como sea posible.  Cuando baje la carga, use las [employer], pero al revs.  NO:  Levante el objeto por arriba de la cintura.  Gire la cintura mientras levanta o sostiene una carga pesada. Si necesita dar vuelta mueva sus pies, no su cintura.  Inclnese hacia adelante sin flexionar las rodillas.  Evite levantar los objetos por arriba de su cabeza, por encima de una mesa o aquellos objetos que se encuentren sobre una superficie elevada. OTROS CONSEJOS   Evite pisar [employer] mojados y [place] las aceras libres de hielo para evitar cadas.  No duerma sobre un colchn muy blando o muy duro.  Mantenga a su alcance los artculos que utiliza con frecuencia.  Coloque los objetos pesados en estantes a nivel de la cintura y los objetos ms livianos en estantes ms altos o ms bajos.  Encuentre formas para reducir Dealer, como el ejercicio, los masajes o las tcnicas de Systems developer. El estrs puede [employer]. Los msculos tensionados son ms vulnerables a las lesiones.  Busque tratamientos para la depresin o la ansiedad si lo necesita. Estos trastornos pueden aumentar el riesgo de Hydrologist de [place]. SOLICITE ATENCIN MDICA SI:   Se lesiona la espalda.  Tiene preguntas sobre la dieta, el ejercicio u otras formas de prevenir las lesiones en la espalda. ASEGRESE DE QUE:   Comprende estas instrucciones.  Controlar su enfermedad.  Solicitar ayuda de inmediato si no mejora o si empeora. Document Released: 03/11/2005 Document Revised: 06/03/2011 [facility] Patient  Information 2015 [place]. This information is not intended to replace advice given to you by your health care provider. Make sure you discuss any questions you have with your health care provider.

## 2014-06-27 NOTE — ED Notes (Signed)
Pt. Reports back pain starting at top of back and down to the sides of legs. Reports has pinched nerves in back and is supposed to have surgery but has not gotten it yet. Used to get spinal blocks for the pain but has not had one recently.

## 2014-07-29 ENCOUNTER — Emergency Department (HOSPITAL_COMMUNITY)
Admission: EM | Admit: 2014-07-29 | Discharge: 2014-07-29 | Disposition: A | Discharge status: Home / Self Care | Payer: Medicare Other | Attending: Emergency Medicine | Admitting: Emergency Medicine

## 2014-07-29 ENCOUNTER — Encounter (HOSPITAL_COMMUNITY): Payer: Self-pay | Admitting: *Deleted

## 2014-07-29 DIAGNOSIS — M1990 Unspecified osteoarthritis, unspecified site: Secondary | ICD-10-CM | POA: Insufficient documentation

## 2014-07-29 DIAGNOSIS — Z7982 Long term (current) use of aspirin: Secondary | ICD-10-CM | POA: Insufficient documentation

## 2014-07-29 DIAGNOSIS — Z3202 Encounter for pregnancy test, result negative: Secondary | ICD-10-CM | POA: Diagnosis not present

## 2014-07-29 DIAGNOSIS — Z79899 Other long term (current) drug therapy: Secondary | ICD-10-CM | POA: Insufficient documentation

## 2014-07-29 DIAGNOSIS — Z8639 Personal history of other endocrine, nutritional and metabolic disease: Secondary | ICD-10-CM | POA: Diagnosis not present

## 2014-07-29 DIAGNOSIS — M549 Dorsalgia, unspecified: Secondary | ICD-10-CM

## 2014-07-29 DIAGNOSIS — M545 Low back pain: Secondary | ICD-10-CM | POA: Insufficient documentation

## 2014-07-29 DIAGNOSIS — G8929 Other chronic pain: Secondary | ICD-10-CM | POA: Insufficient documentation

## 2014-07-29 DIAGNOSIS — I10 Essential (primary) hypertension: Secondary | ICD-10-CM | POA: Diagnosis not present

## 2014-07-29 DIAGNOSIS — Z872 Personal history of diseases of the skin and subcutaneous tissue: Secondary | ICD-10-CM | POA: Insufficient documentation

## 2014-07-29 LAB — POC URINE PREG, ED: Preg Test, Ur: NEGATIVE

## 2014-07-29 MED ORDER — OXYCODONE-ACETAMINOPHEN 5-325 MG PO TABS: 1.0000 | ORAL_TABLET | Freq: Four times a day (QID) | ORAL | Status: DC | PRN

## 2014-07-29 MED ORDER — DIAZEPAM 5 MG PO TABS
5.0000 mg | ORAL_TABLET | Freq: Once | ORAL | Status: AC
  Administered 2014-07-29: 5 mg via ORAL
  Filled 2014-07-29: qty 1

## 2014-07-29 MED ORDER — OXYCODONE-ACETAMINOPHEN 5-325 MG PO TABS
2.0000 | ORAL_TABLET | Freq: Once | ORAL | Status: AC
  Administered 2014-07-29: 2 via ORAL
  Filled 2014-07-29: qty 2

## 2014-07-29 NOTE — ED Provider Notes (Signed)
CSN: [id number]     Arrival date & time 07/29/14  1119 History   First MD Initiated Contact with Patient 07/29/14 1143     Chief Complaint  Patient presents with  . Back Pain     (Consider location/radiation/quality/duration/timing/severity/associated sxs/prior Treatment) HPI   Patient is Spanish-speaking, language interpreter used.  Whitney [name] is a 49 year old female with past medical history of chronic back pain who presents the ER complaining of back pain. Patient states she is currently being seen by orthopedics as well as by rheumatology for her chronic back pain. She states she is currently undergoing paperwork to be seen by pain management clinic, and patient states she ran out of her prescribed Percocet yesterday. Patient states since then she has been having worsening of her back pain. Patient states the pain is midline in her upper back and radiates to her lower back on the right side and down her right leg. Patient reports associated tingling sensation in her right leg. Patient states it hurts to walk, range of motion is difficult due to pain. Patient states that her pain is consistent with and identical to the chronic back pain she has been experiencing. Patient denies new pain, injury or fall, weakness, loss of sensation, saddle anesthesia, bowel/bladder incontinence/retention, history of IV drug use or cancer.  Past Medical History  Diagnosis Date  . Arthritis   . Hypoglycemia   . Hypertension   . Psoriatic arthritis   . Osteoporosis   . Snores    Past Surgical History  Procedure Laterality Date  . Knee surgery    . Joint replacement Left 5/14    knee  . Knee arthroscopy Right 06/08/2013    Procedure: RIGHT ARTHROSCOPY KNEE W/ PARTIAL MEDIAL & LATERAL MENISECTOMY & CHONDROPLASTY;  Surgeon: Velna Ochs, MD;  Location: [facility];  Service: Orthopedics;  Laterality: Right;  . Total knee arthroplasty Right 09/07/2013    Procedure: RIGHT TOTAL KNEE  ARTHROPLASTY;  Surgeon: Velna Ochs, MD;  Location: [facility];  Service: Orthopedics;  Laterality: Right;   No family history on file. History  Substance Use Topics  . Smoking status: Never Smoker   . Smokeless tobacco: Not on file  . Alcohol Use: No   OB History    No data available     Review of Systems  Constitutional: Negative for fever.  HENT: Negative for trouble swallowing.   Eyes: Negative for visual disturbance.  Respiratory: Negative for shortness of breath.   Cardiovascular: Negative for chest pain.  Gastrointestinal: Negative for nausea, vomiting and abdominal pain.  Genitourinary: Negative for dysuria.  Musculoskeletal: Positive for back pain. Negative for neck pain.  Skin: Negative for rash.  Neurological: Negative for dizziness, weakness and numbness.  Psychiatric/Behavioral: Negative.       Allergies  Ibuprofen; Tramadol; and Hydrocodone  Home Medications   Prior to Admission medications   Medication Sig Start Date End Date Taking? Authorizing Provider  aspirin EC 325 MG EC tablet Take 1 tablet (325 mg total) by mouth 2 (two) times daily after a meal. Patient taking differently: Take 325 mg by mouth every morning.  09/10/13  Yes Elodia Florence, PA-C  augmented betamethasone dipropionate (DIPROLENE-AF) 0.05 % cream Apply 1 application topically daily as needed (psoriasis). 04/30/13  Yes Carmelina Dane, MD  captopril (CAPOTEN) 50 MG tablet Take 1 tablet (50 mg total) by mouth 2 (two) times daily. 04/30/13  Yes Carmelina Dane, MD  folic acid (FOLVITE) 1 MG tablet Take 1  tablet (1 mg total) by mouth 2 (two) times daily. 04/30/13  Yes Carmelina Dane, MD  HUMIRA PEN 40 MG/0.8ML PNKT Inject 0.8 mLs as directed every 14 (fourteen) days.  08/04/13  Yes Historical Provider, MD  methocarbamol (ROBAXIN) 500 MG tablet Take 1 tablet (500 mg total) by mouth every 6 (six) hours as needed for muscle spasms. 09/10/13  Yes Elodia Florence, PA-C  methotrexate (RHEUMATREX) 2.5 MG  tablet Take 8 tablets (20 mg total) by mouth once a week. Each Friday Caution:Chemotherapy. Protect from light. 04/30/13  Yes Carmelina Dane, MD  ondansetron (ZOFRAN ODT) 4 MG disintegrating tablet Take 1 tablet (4 mg total) by mouth every 8 (eight) hours as needed for nausea or vomiting. 04/08/14  Yes Arthor Captain, PA-C  ranitidine (ZANTAC) 300 MG tablet Take 300 mg by mouth daily.   Yes Historical Provider, MD  sulfaDIAZINE 500 MG tablet Take 500 mg by mouth daily.   Yes Historical Provider, MD  oxyCODONE-acetaminophen (PERCOCET) 5-325 MG per tablet Take 1-2 tablets by mouth every 6 (six) hours as needed. 07/29/14   Ladona Mow, PA-C   BP 121/61 mmHg  Pulse 66  Temp(Src) 98 F (36.7 C) (Oral)  Resp 18  SpO2 98% Physical Exam  Constitutional: She is oriented to person, place, and time. She appears well-developed and well-nourished. No distress.  HENT:  Head: Normocephalic and atraumatic.  Eyes: Right eye exhibits no discharge. Left eye exhibits no discharge. No scleral icterus.  Neck: Normal range of motion.  Pulmonary/Chest: Effort normal. No respiratory distress.  Musculoskeletal: Normal range of motion.  Neurological: She is alert and oriented to person, place, and time. She has normal strength and normal reflexes. No cranial nerve deficit or sensory deficit. She displays a negative Romberg sign. Coordination and gait normal. GCS eye subscore is 4. GCS verbal subscore is 5. GCS motor subscore is 6.  Patient fully alert, answering questions appropriately in full, clear sentences. Cranial nerves II through XII grossly intact. Motor strength 5 out of 5 in all major muscle groups of upper and lower extremities. Distal sensation intact.   Skin: Skin is warm and dry. She is not diaphoretic.  Psychiatric: She has a normal mood and affect.  Nursing note and vitals reviewed.   ED Course  Procedures (including critical care time) Labs Review Labs Reviewed  POC URINE PREG, ED    Imaging  Review No results found.   EKG Interpretation None      MDM   Final diagnoses:  Chronic back pain    Patient with back pain consistent with chronic pain. Patient's pain today is likely exacerbation of pain as patient has been without her medication since yesterday. No neurological deficits and normal neuro exam.  Patient can walk but states is painful.  No loss of bowel or bladder control.  No concern for cauda equina.  No fever, night sweats, weight loss, h/o cancer, IVDU.  RICE protocol and pain medicine indicated and discussed with patient. I advised patient that with her language barrier understand it is difficult to obtain proper follow-up, however patient states she is in contact with orthopedics, and is currently being referred to pain management clinic. She states she has an appointment, however does not for 2 weeks from now. I advised patient that we would fill a short prescription of pain medicine to help her get that time, however I stressed the fact that we could not continue to fill narcotic prescriptions in the emergency Department for chronic, ongoing pain.  I discussed return precautions with patient, and strongly encouraged her to follow-up with orthopedics, as well as with her appointment at the pain management clinic. Patient verbalizes understanding and agreement with this plan.   BP 121/61 mmHg  Pulse 66  Temp(Src) 98 F (36.7 C) (Oral)  Resp 18  SpO2 98%  Signed,  Ladona Mow, PA-C 9:06 PM     Ladona Mow, PA-C 07/29/14 2106  Gerhard Munch, MD 07/31/14 1056

## 2014-07-29 NOTE — ED Notes (Signed)
[employer] interpreters used for communication with patient. Patient does not speak [place].

## 2014-07-29 NOTE — ED Notes (Signed)
Patient unable to void at this time

## 2014-07-29 NOTE — Discharge Instructions (Signed)
Dolor Radicular (Radicular Pain) El dolor radicular est causado en la irritacin de las races Calio. Generalmente la causa es una degeneracin el un disco de la columna vertebral. Esto produce dolor que se siente en el brazo o la pierna. Entre otras causas del dolor radicular se incluyen:  Nurse, children's  Neuropatas (un estado anormal y a menudo degenerativo del sistema nervioso o nervios). Para el diagnstico le indicarn una tomografa computarizada o imgenes por resonancia magntica para determinar la causa primaria. Los nervios que comienzan en el cuello (races nerviosas) pueden Doctor, general practice radicular en la parte exterior del hombro y en el brazo. ste puede extenderse hacia el pulgar y los dedos. Los sntomas varan segn la raz nerviosa afectada. En la mayor parte de los casos mejora en gran medida con un tratamiento conservador. Para los problemas en el cuello le indicarn fisioterapia, un collar o una traccin cervical. El tratamiento puede durar varias semanas y se considerar la posibilidad de una ciruga si los sntomas no mejoran.  El tratamiento conservador tambin se recomienda para los casos de citica que causan dolor que se irradia desde la zona baja de la espalda o nalgas hacia la pierna o el pie. Generalmente hay una historia previa de problemas en la espalda. La mayora de los pacientes mejora completamente luego de 2 a 4 semanas de reposo en cama y otros tratamientos de apoyo. El reposo en cama reduce la presin en el disco considerablemente. El estar sentado no es una buena posicin, ya que aumenta la presin sobre el disco. Evite encorvarse, levantar mucho peso, permanecer sentado por [employer], y las actividades que puedan empeorar el problema. El los casos ms graves se Magazine features editor traccin. La ciruga est reservada para los [address] Msb no mejoran dentro de los primeros meses de [place]. Utilice los medicamentos de venta libre  o de prescripcin para Chief Technology Officer, Environmental health practitioner o la [place], segn se lo indique el profesional que lo asiste. Los narcticos y los relajantes musculares ayudan a Teacher, early years/pre dolor ms intenso y los espasmos, proporcionando una sedacin [place]. Tressie Stalker de fro o [place] tambin le brindarn [place]. No se recomienda la manipulacin de la columna vertebral. Esto puede aumentar el grado de protrusin del disco. Las inyecciones epidurales con esteroides son a menudo un tratamiento efectivo para el dolor radicular. Esas inyecciones colocan medicamentos al nervio espinal en el espacio entre la cubierta protectora de la mdula espinal y los huesos de la espalda (vertebrae). El profesional que lo asiste podr darle ms informacin acerca de las inyecciones de esteroides. Estas inyecciones son ms efectivas cuando se administran dentro de las [employer] de la aparicin del Engineer, mining.  Comunquese con su mdico para realizar un control segn las indicaciones. Como parte importante del tratamiento deber seguir un programa de rehabilitacin, con ejercicios de elongacin y fortalecimiento.  SOLICITE ATENCIN MDICA DE INMEDIATO SI:  Presenta siente debilidad o adormecimiento inusual en sus brazos o piernas.  Presenta prdida del control del intestino o de la vejiga.  Presenta dolor abdominal. Document Released: 03/11/2005 Document Revised: 06/03/2011 ExitCare Patient Information 2015 [place], [place]. This information is not intended to replace advice given to you by your health care provider. Make sure you discuss any questions you have with your health care provider.   Ejercicios para la espalda (Back Exercises) Estos ejercicios ayudan a tratar y prevenir lesiones en la espalda. El objetivo es aumentar la fuerza de los msculos abdominales y dorsales y la flexibilidad de  la espalda. Debe comenzar con estos ejercicios cuando ya no tenga dolor. Los ejercicios para la espalda incluyen:  Inclinacin de la pelvis -  Recustese sobre la espalda con las rodillas flexionadas. Incline la pelvis hasta que la parte inferior de la espalda se apoye en el piso. Mantenga esta posicin durante 5 a 10 segundos y repita entre 5 y 10 veces.  Rodilla al pecho - Empuje primero una rodilla contra el pecho y [place] 20 a 30 segundos; repita con la otra rodilla y luego con ambas a la vez. Esto puede realizarlo con la otra pierna extendida o flexionada, del modo en que se sienta ms cmodo.  Abdominales o despegar el cccix del suelo empleando la musculatura abdominal - Flexione las rodillas 90 grados. Comience inclinando la pelvis y realice un ejercicio abdominal lento y parcial, elevando el tronco slo entre 30 y 45 grados del suelo. Emplee al [employer] 2 y 3 segundos para cada abdominal. No realice los abdominales con las rodillas extendidas. Si le resulta difcil realizar abdominales parciales, simplemente haga lo que se explic anteriormente, pero slo contraiga los msculos abdominales y Buyer, retail tal como se le ha indicado.  Inclinacin de la cadera - Recustese sobre la espalda con las rodillas flexionadas a 90 grados. Empjese con los pies y los hombros mientras eleva la cadera un par de centmetros del suelo, [place] durante 10 segundos y repita entre 5 y 10 veces.  Arcos dorsales - Acustese sobre el [place] e impulse el tronco hacia atrs sobre los codos flexionados. Presione lentamente con las manos, formando un arco con la zona inferior de la espalda. Repita entre 3 y 5 veces. Al realizar las repeticiones, luego de un tiempo disminuirn la rigidez y las [place].  Elevacin de los hombros - Acustese hacia abajo con los brazos a los lados del cuerpo. Presione las caderas y Dance movement psychotherapist torso contra el suelo mientras eleva lentamente la cabeza y los hombros del suelo. No exagere con los ejercicios, especialmente en el comienzo. Los ejercicios pueden causar alguna molestia leve en la espalda durante algunos minutos;  sin embargo, si el dolor es muy intenso, o dura ms de 15 minutos, no siga con la actividad fsica hasta que consulte al profesional que lo asiste. Los problemas en la espalda mejoran de May lenta con esta terapia.  Consulte al profesional para que lo ayude a planificar un programa de ejercicios adecuado para su espalda. Document Released: 03/11/2005 Document Revised: 06/03/2011 [facility] Patient Information 2015 [place], [place]. This information is not intended to replace advice given to you by your health care provider. Make sure you discuss any questions you have with your health care provider.   Emergency Department Resource Guide 1) Find a Doctor and Pay Out of Pocket Although you won't have to find out who is covered by your insurance plan, it is a good idea to ask around and get recommendations. You will then need to call the office and see if the doctor you have chosen will accept you as a new patient and what types of options they offer for patients who are self-pay. Some doctors offer discounts or will set up payment plans for their patients who do not have insurance, but you will need to ask so you aren't surprised when you get to your appointment.  2) Contact Your Local Health Department Not all health departments have doctors that can see patients for sick visits, but many do, so it is worth a call to see if yours does.  If you don't know where your local health department is, you can check in your phone book. The CDC also has a tool to help you locate your state's health department, and many state websites also have listings of all of their local health departments.  3) Find a Walk-in Clinic If your illness is not likely to be very severe or complicated, you may want to try a walk in clinic. These are popping up all over the country in pharmacies, drugstores, and shopping centers. They're usually staffed by nurse practitioners or physician assistants that have been trained to treat  common illnesses and complaints. They're usually fairly quick and inexpensive. However, if you have serious medical issues or chronic medical problems, these are probably not your best option.  No Primary Care Doctor: - Call Health Connect at  [phone] - they can help you locate a primary care doctor that  accepts your insurance, provides certain services, etc. - Physician Referral Service- [phone]  Chronic Pain Problems: Organization         Address  Phone   Notes  [facility]  [phone] Patients need to be referred by their primary care doctor.   Medication Assistance: Organization         Address  Phone   Notes  [facility] Medication [facility] [address] [place] [place], [place] [address] [phone] --Must be a resident of [facility] -- Must have NO insurance coverage whatsoever (no Medicaid/ Medicare, etc.) -- The pt. MUST have a primary care doctor that directs their care regularly and follows them in the community   MedAssist  [phone]   [employer]  [phone]    Agencies that provide inexpensive medical care: Organization         Address  Phone   Notes  [facility]  [phone]   [facility]    [phone]   [facility] [address] [place], [place] [address] [phone]   [facility] [address] [place] [place] [phone]   Planned Parenthood    [phone]   [facility]    [phone]   [facility]  [address], [place] Phone:  [phone], Fax:  [phone] Hours of Operation:  9 am - 6 pm, M-F.  Also accepts Medicaid/Medicare and self-pay.  [facility]  [address], [place] Phone: [phone], Fax: [phone]. Hours of Operation:  8:30 am - 5:30 pm, M-F.  Also accepts Medicaid and self-pay.  [facility] [place] [address] [place] Phone: [phone]   [facility] [address] Natasha Bence [place], [place] [phone], Ext. [phone] Mondays & Thursdays: 7-9 AM.  First 15 patients are seen on a first come, first serve basis.    Medicaid-accepting [facility] Providers:  Organization         Address  Phone   Notes  [facility] [address], [place] [phone] Also accepts self-pay patients.  [facility] [address] Laurell Josephs [place], [place]  [phone]   [facility] [address], [place] [phone]   [facility] [address] [place] [phone]   Renaye Rakers [address], [place]   [phone] Only  accepts [place] [employer] patients after they have their name applied to their card.   Self-Pay (no insurance) in [facility]:  Organization         Address  Phone   Notes  Sickle Cell Patients, [facility] [address] [place], [place] [phone]   [facility] [address] [place], [place] [phone]   [facility]  [address], [place] [phone]   [facility]/Dr. Osei-Bonsu  [address], [place] or [address], [place] [phone] Phone number for both [place] and [place] locations is the same.  [facility] [address] [place] [phone]   [facility] [address], [place] or [address] [phone]   [facility] [address], [place] [phone], phone; [phone], fax Sees patients 1st and 3rd Saturday of every month.  Must not qualify for public or private insurance (i.e. Medicaid, Medicare, [place] Health Choice, Veterans' Benefits)  Household income should be no more than 200% of the  poverty level The clinic cannot treat you if you are pregnant or think you are pregnant  Sexually transmitted diseases are not treated at the clinic.    Dental Care: Organization         Address  Phone  Notes  [facility] Department of [facility] [address] [place], [place] [phone] Accepts children up to age 37 who are enrolled in [place] or [place] Health Choice; pregnant women with a Medicaid card; and children who have applied for Medicaid or [place] Health Choice, but were declined, whose parents can pay a reduced fee at time of service.  [facility] Department of [facility]  [address], [place] [phone] Accepts children up to age 80 who are enrolled in [place] or [place] Health Choice; pregnant women with a Medicaid card; and children who have applied for Medicaid or  Health Choice, but were declined, whose parents can pay a reduced fee at time of service.  [facility]  [address] [place], [place] [phone] Patients are seen by appointment only. Walk-ins are not accepted. [facility] will see patients 35 years of age and older. Monday - Tuesday (8am-5pm) Most Wednesdays (8:30-5pm) $30 per visit, cash only  [facility] Adult Dental Access PROGRAM  [address], [facility] [phone] Patients are seen by appointment only. Walk-ins are not accepted. [facility] will see patients 43 years of age and older. One Wednesday Evening (Monthly: Volunteer Based).  $30 per visit, cash only  [employer] of [employer]  [phone] for adults; Children under age 14, call Graduate Pediatric Dentistry at [phone]. Children aged 33-14, please call [phone] to request a pediatric application.  Dental services are provided in all areas of dental care including fillings, crowns and bridges, complete and partial dentures, implants, gum treatment, root canals, and extractions.  Preventive care is also provided. Treatment is provided to both adults and children. Patients are selected via a lottery and there is often a waiting list.   [facility] [address], [place]  [phone] [email or web address]   [facility] [address] [place], [place] [phone], Ext. [phone] Second and Fourth Thursday of each month, opens at 6:30 AM; Clinic ends  at 9 AM.  Patients are seen on a first-come first-served basis, and a limited number are seen during each clinic.   [facility]  [address] Ether Griffins [place], [place] [phone]   Eligibility Requirements You must have lived in [place], [place], or [place] counties for at least the last three months.   You cannot be eligible for state or federal sponsored [place], including [employer], [place], or [employer].   You generally cannot be eligible for healthcare insurance through your employer.    How to apply: Eligibility screenings are held every Tuesday and Wednesday afternoon from 1:00 pm until 4:00 pm. You do not need an appointment for the interview!  [facility] [address] [place], [place] [phone]   [facility] Health Department  [phone]   [facility] Health Department  [phone]   [facility] Health Department  [phone]    Behavioral Health Resources in the Community: Intensive Outpatient Programs Organization         Address  Phone  Notes  [facility] [address] [place], [place] [phone]   [facility] [address], [place], [place] [phone]   [facility]: [facility] [address] [place], [place]  [phone]   [facility] Mental Health [address],  [place], [place] [phone] or [phone]   Substance Abuse Resources Organization         Address  Phone  Notes  [facility] Drug Services  [phone]   Addiction  [facility]  [phone]   [facility] [place]  [phone]   Floydene Flock  [phone]   Residential & Outpatient Substance Abuse Program  [phone]   Psychological Services Organization         Address  Phone  Notes  [facility] Behavioral Health  [phone]   [facility] Services  [phone]   [facility] Mental Health [address] [place] [phone] or [phone]    Mobile Crisis Teams Organization         Address  Phone  Notes  [facility], Mobile Crisis Care Unit  [phone]   [facility]  [address] [place], [place] [phone]   Doristine Locks [address] [place] [phone]    Self-Help/Support Groups Organization         Address  Phone             Notes  [facility] [place] - variety of support groups  336- I7437963 Call for more information  Narcotics Anonymous (NA), [facility] [address], [employer] [place]  2 meetings at this location   Statistician         Address  Phone  Notes  [facility] [address],    [place]  [phone]   [facility]  [address] [place] [address], [place], [place] [phone]   [facility] [address] [place], [place] [phone] Admissions: 8am-3pm M-F  [facility] [address],    [place], [place] [phone]   [facility] [address] Starling Manns [place], [place] [phone]   [facility] [address]  [place], [place] [phone]   [facility] - Intensive Outpatient [address] Laurell Joseph[address], [place], [place] [phone]   [facility] (Addiction [facility] [address] Henderson Cloud  [place], [place] [phone] or [phone]   [facility]  Services (RTS) [address], [place], [place] [phone] Accepts Medicaid  [facility] [address]  [place]  [phone] Substance Abuse/Addiction Treatment   [facility] Organization         Address  Phone  Notes  [facility]  [phone]   Angie Fava, PhD [address] Ervin Knack [place], [place]   [phone] or [phone]   [facility] Behavioral   [address] [place], [place] [phone]   [facility] [address] [place], [place] [phone] Insurance/Medicaid/sponsorship through [facility] and Families [address]                                    [place], [place] [phone] Therapy/tele-psych/case  [facility] [address], [place] [phone]    Dr. Lolly Mustache  [phone]   [facility]  [employer] Health [facility] Dept. 1) [address] [place] 2) [address], [place] 3)  [address], [place] [phone]  [phone]   [facility] Child Abuse Hotline 9382487512 or [phone] (After Hours)

## 2014-07-29 NOTE — ED Notes (Signed)
Patient states that she has been having back issues for some time. She was told following MRI in the past that she has pinched nerves and that she was a surgical candidate but declined due to fear. She has received injections and pain medication in the past. She no longer has prescribed medications and is reconsidering surgery due to increase in pain.

## 2014-07-31 ENCOUNTER — Emergency Department (HOSPITAL_COMMUNITY)
Admission: EM | Admit: 2014-07-31 | Discharge: 2014-07-31 | Disposition: A | Discharge status: Home / Self Care | Payer: Medicare Other | Attending: Emergency Medicine | Admitting: Emergency Medicine

## 2014-07-31 ENCOUNTER — Emergency Department (HOSPITAL_COMMUNITY): Payer: Medicare Other

## 2014-07-31 ENCOUNTER — Encounter (HOSPITAL_COMMUNITY): Payer: Self-pay

## 2014-07-31 DIAGNOSIS — Z7982 Long term (current) use of aspirin: Secondary | ICD-10-CM | POA: Insufficient documentation

## 2014-07-31 DIAGNOSIS — T1490XA Injury, unspecified, initial encounter: Secondary | ICD-10-CM

## 2014-07-31 DIAGNOSIS — I10 Essential (primary) hypertension: Secondary | ICD-10-CM | POA: Diagnosis not present

## 2014-07-31 DIAGNOSIS — Y9289 Other specified places as the place of occurrence of the external cause: Secondary | ICD-10-CM | POA: Diagnosis not present

## 2014-07-31 DIAGNOSIS — Z872 Personal history of diseases of the skin and subcutaneous tissue: Secondary | ICD-10-CM | POA: Insufficient documentation

## 2014-07-31 DIAGNOSIS — Y998 Other external cause status: Secondary | ICD-10-CM | POA: Diagnosis not present

## 2014-07-31 DIAGNOSIS — M1990 Unspecified osteoarthritis, unspecified site: Secondary | ICD-10-CM | POA: Insufficient documentation

## 2014-07-31 DIAGNOSIS — S0083XA Contusion of other part of head, initial encounter: Secondary | ICD-10-CM | POA: Insufficient documentation

## 2014-07-31 DIAGNOSIS — S79911A Unspecified injury of right hip, initial encounter: Secondary | ICD-10-CM | POA: Insufficient documentation

## 2014-07-31 DIAGNOSIS — Y9389 Activity, other specified: Secondary | ICD-10-CM | POA: Insufficient documentation

## 2014-07-31 DIAGNOSIS — T07XXXA Unspecified multiple injuries, initial encounter: Secondary | ICD-10-CM

## 2014-07-31 DIAGNOSIS — W109XXA Fall (on) (from) unspecified stairs and steps, initial encounter: Secondary | ICD-10-CM | POA: Insufficient documentation

## 2014-07-31 DIAGNOSIS — S299XXA Unspecified injury of thorax, initial encounter: Secondary | ICD-10-CM | POA: Insufficient documentation

## 2014-07-31 DIAGNOSIS — S3991XA Unspecified injury of abdomen, initial encounter: Secondary | ICD-10-CM | POA: Diagnosis not present

## 2014-07-31 DIAGNOSIS — Z79899 Other long term (current) drug therapy: Secondary | ICD-10-CM | POA: Insufficient documentation

## 2014-07-31 DIAGNOSIS — S0990XA Unspecified injury of head, initial encounter: Secondary | ICD-10-CM | POA: Diagnosis present

## 2014-07-31 LAB — I-STAT BETA HCG BLOOD, ED (MC, WL, AP ONLY)

## 2014-07-31 LAB — CBC
HCT: 40.7 % (ref 36.0–46.0)
Hemoglobin: 12.7 g/dL (ref 12.0–15.0)
MCH: 28.2 pg (ref 26.0–34.0)
MCHC: 31.2 g/dL (ref 30.0–36.0)
MCV: 90.2 fL (ref 78.0–100.0)
PLATELETS: 288 10*3/uL (ref 150–400)
RBC: 4.51 MIL/uL (ref 3.87–5.11)
RDW: 13.4 % (ref 11.5–15.5)
WBC: 5.8 10*3/uL (ref 4.0–10.5)

## 2014-07-31 LAB — RAPID URINE DRUG SCREEN, HOSP PERFORMED
AMPHETAMINES: NOT DETECTED
BENZODIAZEPINES: NOT DETECTED
Barbiturates: NOT DETECTED
COCAINE: NOT DETECTED
Opiates: NOT DETECTED
Tetrahydrocannabinol: NOT DETECTED

## 2014-07-31 LAB — I-STAT CHEM 8, ED
BUN: 13 mg/dL (ref 6–20)
CREATININE: 0.6 mg/dL (ref 0.44–1.00)
Calcium, Ion: 1.17 mmol/L (ref 1.12–1.23)
Chloride: 106 mmol/L (ref 101–111)
Glucose, Bld: 99 mg/dL (ref 70–99)
HEMATOCRIT: 44 % (ref 36.0–46.0)
HEMOGLOBIN: 15 g/dL (ref 12.0–15.0)
POTASSIUM: 3.8 mmol/L (ref 3.5–5.1)
SODIUM: 141 mmol/L (ref 135–145)
TCO2: 21 mmol/L (ref 0–100)

## 2014-07-31 LAB — TYPE AND SCREEN
ABO/RH(D): O POS
ANTIBODY SCREEN: NEGATIVE

## 2014-07-31 LAB — CBG MONITORING, ED: GLUCOSE-CAPILLARY: 89 mg/dL (ref 70–99)

## 2014-07-31 MED ORDER — FENTANYL CITRATE (PF) 100 MCG/2ML IJ SOLN
50.0000 ug | INTRAMUSCULAR | Status: DC | PRN
  Administered 2014-07-31: 50 ug via INTRAVENOUS
  Filled 2014-07-31: qty 2

## 2014-07-31 MED ORDER — IOHEXOL 300 MG/ML  SOLN
100.0000 mL | Freq: Once | INTRAMUSCULAR | Status: AC | PRN
  Administered 2014-07-31: 100 mL via INTRAVENOUS

## 2014-07-31 MED ORDER — ONDANSETRON HCL 4 MG/2ML IJ SOLN
INTRAMUSCULAR | Status: AC
  Filled 2014-07-31: qty 2

## 2014-07-31 MED ORDER — ONDANSETRON HCL 4 MG/2ML IJ SOLN
4.0000 mg | Freq: Once | INTRAMUSCULAR | Status: AC
  Administered 2014-07-31: 4 mg via INTRAVENOUS

## 2014-07-31 MED ORDER — ONDANSETRON 4 MG PO TBDP: 4.0000 mg | ORAL_TABLET | Freq: Three times a day (TID) | ORAL | Status: DC | PRN

## 2014-07-31 NOTE — ED Notes (Signed)
Via interpreter discharge prescriptions discussed with patient.  Per Dr. Hyacinth Meeker a Percocet Rx was given to patient 2 days ago, no pain med prescription will be given for this visit.  Zofran prescription discussed with patient.  Per Redmond Baseman RN discharge instructions were already discussed with patient.  Pt does not have any further questions, verbalized understanding of d/c instructions.

## 2014-07-31 NOTE — ED Notes (Signed)
Paper scrubs given to patient to wear as her cloths were cut off. Pts clothing bagged and sent with pt.

## 2014-07-31 NOTE — Discharge Instructions (Signed)
°,  Please call your doctor for a followup appointment within 24-48 hours. When you talk to your doctor please let them know that you were seen in the emergency department and have them acquire all of your records so that they can discuss the findings with you and formulate a treatment plan to fully care for your new and ongoing problems.   Contusin  (Contusion)  Una contusin es un hematoma interno. Las contusiones ocurren cuando un traumatismo causa un sangrado debajo de la piel. Los signos de hematoma son dolor, inflamacin (hinchazn) y cambio de color en la piel. La contusin [employer], prpura o [place]. CUIDADOS EN EL HOGAR   Aplique hielo sobre la zona lesionada.  Ponga el hielo en una bolsa plstica.  Colquese una toalla entre la piel y la bolsa de hielo.  Deje el hielo durante 15 a 20 minutos, 3 a 4 veces por da.  Slo tome los medicamentos segn le indique el mdico.  Haga que la zona lesionada repose.  En lo posible, levante (eleve) la zona lesionada para disminuir la hinchazn. SOLICITE AYUDA DE INMEDIATO SI:   El hematoma o la hinchazn aumentan.  Siente que Community education officer.  La hinchazn o el dolor no se [employer]. ASEGRESE DE QUE:   Comprende estas instrucciones.  Controlar su enfermedad.  Solicitar ayuda de inmediato si no mejora o si empeora. Document Released: 02/28/2011 Document Revised: 06/03/2011 [facility] Patient Information 2015 [place], [place]. This information is not intended to replace advice given to you by your health care provider. Make sure you discuss any questions you have with your health care provider.

## 2014-07-31 NOTE — ED Provider Notes (Signed)
CSN: [id number]     Arrival date & time 07/31/14  1156 History   First MD Initiated Contact with Patient 07/31/14 1201     Chief Complaint  Patient presents with  . Fall     (Consider location/radiation/quality/duration/timing/severity/associated sxs/prior Treatment) HPI Comments: 49 year old female, she is morbidly obese, she is a Spanish-speaking patient, she reports to me through a language interpreter that she slipped and fell down the stairs this morning at her house, this was approximately 10 or 12 stairs, the paramedics report a Glasgow Coma Score of 12 prior to arrival, immobilized the patient on a spine board with a cervical collar. The patient has had a decreased level of consciousness with an obvious hematoma to the right forehead, shortening and external rotation of the right lower extremity, she was not given any pain medication in route because of decreased mental status. This occurred just prior to arrival, there is been no seizures or vomiting though the patient is nauseated.  Patient is a 49 y.o. female presenting with fall. The history is provided by the patient, a relative, a significant other and medical records. A language interpreter was used.  Fall    Past Medical History  Diagnosis Date  . Arthritis   . Hypoglycemia   . Hypertension   . Psoriatic arthritis   . Osteoporosis   . Snores    Past Surgical History  Procedure Laterality Date  . Knee surgery    . Joint replacement Left 5/14    knee  . Knee arthroscopy Right 06/08/2013    Procedure: RIGHT ARTHROSCOPY KNEE W/ PARTIAL MEDIAL & LATERAL MENISECTOMY & CHONDROPLASTY;  Surgeon: Velna Ochs, MD;  Location: [facility];  Service: Orthopedics;  Laterality: Right;  . Total knee arthroplasty Right 09/07/2013    Procedure: RIGHT TOTAL KNEE ARTHROPLASTY;  Surgeon: Velna Ochs, MD;  Location: [facility] OR;  Service: Orthopedics;  Laterality: Right;   No family history on file. History  Substance  Use Topics  . Smoking status: Never Smoker   . Smokeless tobacco: Not on file  . Alcohol Use: No   OB History    No data available     Review of Systems  All other systems reviewed and are negative.     Allergies  Ibuprofen; Tramadol; and Hydrocodone  Home Medications   Prior to Admission medications   Medication Sig Start Date End Date Taking? Authorizing Provider  aspirin EC 325 MG EC tablet Take 1 tablet (325 mg total) by mouth 2 (two) times daily after a meal. Patient taking differently: Take 325 mg by mouth every morning.  09/10/13   Elodia Florence, PA-C  augmented betamethasone dipropionate (DIPROLENE-AF) 0.05 % cream Apply 1 application topically daily as needed (psoriasis). 04/30/13   Carmelina Dane, MD  captopril (CAPOTEN) 50 MG tablet Take 1 tablet (50 mg total) by mouth 2 (two) times daily. 04/30/13   Carmelina Dane, MD  folic acid (FOLVITE) 1 MG tablet Take 1 tablet (1 mg total) by mouth 2 (two) times daily. 04/30/13   Carmelina Dane, MD  HUMIRA PEN 40 MG/0.8ML PNKT Inject 0.8 mLs as directed every 14 (fourteen) days.  08/04/13   Historical Provider, MD  methocarbamol (ROBAXIN) 500 MG tablet Take 1 tablet (500 mg total) by mouth every 6 (six) hours as needed for muscle spasms. 09/10/13   Elodia Florence, PA-C  methotrexate (RHEUMATREX) 2.5 MG tablet Take 8 tablets (20 mg total) by mouth once a week. Each Friday Caution:Chemotherapy. Protect  from light. 04/30/13   Carmelina Dane, MD  ondansetron (ZOFRAN ODT) 4 MG disintegrating tablet Take 1 tablet (4 mg total) by mouth every 8 (eight) hours as needed for nausea or vomiting. 04/08/14   Arthor Captain, PA-C  oxyCODONE-acetaminophen (PERCOCET) 5-325 MG per tablet Take 1-2 tablets by mouth every 6 (six) hours as needed. 07/29/14   Ladona Mow, PA-C  ranitidine (ZANTAC) 300 MG tablet Take 300 mg by mouth daily.    Historical Provider, MD  sulfaDIAZINE 500 MG tablet Take 500 mg by mouth daily.    Historical Provider, MD   BP 105/63  mmHg  Pulse 59  Temp(Src) 98.6 F (37 C) (Oral)  Resp 38  SpO2 98% Physical Exam  Constitutional: She appears well-developed and well-nourished. She appears distressed.  HENT:  Head: Normocephalic.  Mouth/Throat: Oropharynx is clear and moist. No oropharyngeal exudate.  Hematoma right forehead, tympanic membranes without hemotympanum, no malocclusion, raccoon eyes or Battle sign  Eyes: Conjunctivae and EOM are normal. Pupils are equal, round, and reactive to light. Right eye exhibits no discharge. Left eye exhibits no discharge. No scleral icterus.  2 mm pupils, reactive bilaterally  Neck: Normal range of motion. Neck supple. No JVD present. No thyromegaly present.  Cardiovascular: Normal rate, regular rhythm, normal heart sounds and intact distal pulses.  Exam reveals no gallop and no friction rub.   No murmur heard. Pulmonary/Chest: Effort normal and breath sounds normal. No respiratory distress. She has no wheezes. She has no rales. She exhibits tenderness ( Bilateral anterior and lateral mild chest tenderness to palpation. No crepitance or subcutaneous emphysema).  Abdominal: Soft. Bowel sounds are normal. She exhibits no distension and no mass. There is tenderness ( Tender to palpation over the right lower abdomen).  Musculoskeletal: Normal range of motion. She exhibits tenderness ( Severe pain with range of motion of the right hip, the leg is shortened and externally rotated, normal range of motion of all other extremities, soft compartments, supple joints). She exhibits no edema.  Lymphadenopathy:    She has no cervical adenopathy.  Neurological: She is alert. Coordination normal.  Follows commands without difficulty, appears sleepy, eyes closed, opens to command, follows commands without difficulty, speech is clear according to interpreter  Skin: Skin is warm and dry. No rash noted. No erythema.  Psychiatric: She has a normal mood and affect. Her behavior is normal.  Nursing note  and vitals reviewed.   ED Course  Procedures (including critical care time) Labs Review Labs Reviewed  CBC  URINE RAPID DRUG SCREEN (HOSP PERFORMED)  CBG MONITORING, ED  I-STAT CHEM 8, ED  I-STAT BETA HCG BLOOD, ED ([facility], [facility], [facility] ONLY)  TYPE AND SCREEN    Imaging Review Ct Head Wo Contrast  07/31/2014   CLINICAL DATA:  Fall and loss of consciousness. Back pain. Dizziness. cervicalgia/neck pain.  EXAM: CT HEAD WITHOUT CONTRAST  CT CERVICAL SPINE WITHOUT CONTRAST  TECHNIQUE: Multidetector CT imaging of the head and cervical spine was performed following the standard protocol without intravenous contrast. Multiplanar CT image reconstructions of the cervical spine were also generated.  COMPARISON:  None.  FINDINGS: CT HEAD FINDINGS  No mass lesion, mass effect, midline shift, hydrocephalus, hemorrhage. No territorial ischemia or acute infarction.  CT CERVICAL SPINE FINDINGS  Anatomic alignment. No fracture. Craniocervical junction appears normal. Study degraded by obese body habitus. Severe RIGHT C3-C4 facet arthrosis. Lung apices are normal.  IMPRESSION: Negative CT head and cervical spine aside from degenerative disease of the C-spine.  Electronically Signed   By: Andreas Newport M.D.   On: 07/31/2014 13:51   Ct Chest W Contrast  07/31/2014   CLINICAL DATA:  Severe back pain after falling down apartment steps today.  EXAM: CT CHEST, ABDOMEN, AND PELVIS WITH CONTRAST  TECHNIQUE: Multidetector CT imaging of the chest, abdomen and pelvis was performed following the standard protocol during bolus administration of intravenous contrast.  CONTRAST:  OMNIPAQUE IOHEXOL 300 MG/ML  SOLN  COMPARISON:  Pelvis radiograph obtained earlier today and thoracic spine and lumbar spine radiographs obtained yesterday. Chest radiographs dated 08/31/2013.  FINDINGS: CT CHEST FINDINGS  Breathing motion artifacts. Mild bilateral dependent atelectasis. No fractures or pneumothorax. No pleural or mediastinal fluid. No  visible lung nodules and no enlarged lymph nodes. Extensive thoracic spine degenerative changes. T6 vertebral meningioma.  CT ABDOMEN AND PELVIS FINDINGS  Small umbilical hernia containing fat. Diffuse low density of the liver relative to the spleen. The spleen, pancreas, gallbladder, adrenal glands, left kidney, urinary bladder, uterus and ovaries have normal appearances. Probable tiny cyst in the medial aspect of the mid right kidney. No gastrointestinal abnormalities or enlarged lymph nodes. No fractures or free peritoneal fluid. Lumbar spine degenerative changes.  IMPRESSION: 1. No acute injury. 2. Small umbilical hernia containing fat. 3. Mild diffuse hepatic steatosis.   Electronically Signed   By: Beckie Salts M.D.   On: 07/31/2014 13:57   Ct Cervical Spine Wo Contrast  07/31/2014   CLINICAL DATA:  Fall and loss of consciousness. Back pain. Dizziness. cervicalgia/neck pain.  EXAM: CT HEAD WITHOUT CONTRAST  CT CERVICAL SPINE WITHOUT CONTRAST  TECHNIQUE: Multidetector CT imaging of the head and cervical spine was performed following the standard protocol without intravenous contrast. Multiplanar CT image reconstructions of the cervical spine were also generated.  COMPARISON:  None.  FINDINGS: CT HEAD FINDINGS  No mass lesion, mass effect, midline shift, hydrocephalus, hemorrhage. No territorial ischemia or acute infarction.  CT CERVICAL SPINE FINDINGS  Anatomic alignment. No fracture. Craniocervical junction appears normal. Study degraded by obese body habitus. Severe RIGHT C3-C4 facet arthrosis. Lung apices are normal.  IMPRESSION: Negative CT head and cervical spine aside from degenerative disease of the C-spine.   Electronically Signed   By: Andreas Newport M.D.   On: 07/31/2014 13:51   Ct Abdomen Pelvis W Contrast  07/31/2014   CLINICAL DATA:  Severe back pain after falling down apartment steps today.  EXAM: CT CHEST, ABDOMEN, AND PELVIS WITH CONTRAST  TECHNIQUE: Multidetector CT imaging of the chest,  abdomen and pelvis was performed following the standard protocol during bolus administration of intravenous contrast.  CONTRAST:  OMNIPAQUE IOHEXOL 300 MG/ML  SOLN  COMPARISON:  Pelvis radiograph obtained earlier today and thoracic spine and lumbar spine radiographs obtained yesterday. Chest radiographs dated 08/31/2013.  FINDINGS: CT CHEST FINDINGS  Breathing motion artifacts. Mild bilateral dependent atelectasis. No fractures or pneumothorax. No pleural or mediastinal fluid. No visible lung nodules and no enlarged lymph nodes. Extensive thoracic spine degenerative changes. T6 vertebral meningioma.  CT ABDOMEN AND PELVIS FINDINGS  Small umbilical hernia containing fat. Diffuse low density of the liver relative to the spleen. The spleen, pancreas, gallbladder, adrenal glands, left kidney, urinary bladder, uterus and ovaries have normal appearances. Probable tiny cyst in the medial aspect of the mid right kidney. No gastrointestinal abnormalities or enlarged lymph nodes. No fractures or free peritoneal fluid. Lumbar spine degenerative changes.  IMPRESSION: 1. No acute injury. 2. Small umbilical hernia containing fat. 3. Mild diffuse hepatic  steatosis.   Electronically Signed   By: Beckie Salts M.D.   On: 07/31/2014 13:57   Dg Pelvis Portable  07/31/2014   CLINICAL DATA:  ED nurse: fall down apartment steps. Pain to right hip.  EXAM: PORTABLE PELVIS 1-2 VIEWS  COMPARISON:  None.  FINDINGS: No fracture. No bone lesion. Hip joints, SI joints and symphysis pubis are normally spaced and aligned. Soft tissues are unremarkable.  IMPRESSION: Negative.   Electronically Signed   By: Amie Portland M.D.   On: 07/31/2014 13:08   Dg Femur, Min 2 Views Right  07/31/2014   CLINICAL DATA:  Pt has back pain that radiates down right leg. Pt is in severe pain and has limited ROM of leg. Pt's pain seems to be in her proximal hip and femur.  EXAM: RIGHT FEMUR 2 VIEWS  COMPARISON:  None.  FINDINGS: No fracture. No bone lesion.  Hip joint is normally space and aligned. Right knee prosthesis is normally aligned. Prosthetic components appear well seated.  Soft tissues are unremarkable.  IMPRESSION: No fracture, bone lesion or dislocation. The prosthesis well-aligned. No right hip joint arthropathic change.   Electronically Signed   By: Amie Portland M.D.   On: 07/31/2014 14:40     EKG Interpretation   Date/Time:  Sunday Jul 31 2014 12:05:53 EDT Ventricular Rate:  66 PR Interval:  130 QRS Duration: 93 QT Interval:  416 QTC Calculation: 436 R Axis:   -5 Text Interpretation:  Sinus rhythm Low voltage, precordial leads since  last tracing no significant change Confirmed by Hyacinth Meeker  MD, Khamila Bassinger ([id number])  on 07/31/2014 2:15:14 PM      MDM   Final diagnoses:  Trauma  Contusion of multiple sites    The patient has had a clear traumatic injury, this is consistent with a fall, she will need imaging of her brain, cervical spine, abdomen and pelvis as well as the bony thorax and the bony pelvis and hip, suspect right hip fracture, closed head injury, patient will be placed on the cardiac monitor, given supple metal oxygen, maintain spinal immobilization until imaging shows no fractures. This was a splint to the patient through the interpreter, she is in agreement with the plan.    Eber Hong, MD 07/31/14 [phone]

## 2014-10-06 ENCOUNTER — Emergency Department (HOSPITAL_COMMUNITY): Payer: Medicare Other

## 2014-10-06 ENCOUNTER — Emergency Department (HOSPITAL_COMMUNITY)
Admission: EM | Admit: 2014-10-06 | Discharge: 2014-10-06 | Disposition: A | Discharge status: Home / Self Care | Payer: Medicare Other | Attending: Emergency Medicine | Admitting: Emergency Medicine

## 2014-10-06 ENCOUNTER — Encounter (HOSPITAL_COMMUNITY): Payer: Self-pay | Admitting: Emergency Medicine

## 2014-10-06 DIAGNOSIS — Z79899 Other long term (current) drug therapy: Secondary | ICD-10-CM | POA: Insufficient documentation

## 2014-10-06 DIAGNOSIS — Z7982 Long term (current) use of aspirin: Secondary | ICD-10-CM | POA: Insufficient documentation

## 2014-10-06 DIAGNOSIS — Y9389 Activity, other specified: Secondary | ICD-10-CM | POA: Diagnosis not present

## 2014-10-06 DIAGNOSIS — Y9289 Other specified places as the place of occurrence of the external cause: Secondary | ICD-10-CM | POA: Diagnosis not present

## 2014-10-06 DIAGNOSIS — Y999 Unspecified external cause status: Secondary | ICD-10-CM | POA: Diagnosis not present

## 2014-10-06 DIAGNOSIS — Z3202 Encounter for pregnancy test, result negative: Secondary | ICD-10-CM | POA: Diagnosis not present

## 2014-10-06 DIAGNOSIS — M069 Rheumatoid arthritis, unspecified: Secondary | ICD-10-CM | POA: Insufficient documentation

## 2014-10-06 DIAGNOSIS — S0990XA Unspecified injury of head, initial encounter: Secondary | ICD-10-CM | POA: Insufficient documentation

## 2014-10-06 DIAGNOSIS — M810 Age-related osteoporosis without current pathological fracture: Secondary | ICD-10-CM | POA: Diagnosis not present

## 2014-10-06 DIAGNOSIS — I10 Essential (primary) hypertension: Secondary | ICD-10-CM | POA: Insufficient documentation

## 2014-10-06 DIAGNOSIS — W108XXA Fall (on) (from) other stairs and steps, initial encounter: Secondary | ICD-10-CM | POA: Diagnosis not present

## 2014-10-06 DIAGNOSIS — S7001XA Contusion of right hip, initial encounter: Secondary | ICD-10-CM | POA: Insufficient documentation

## 2014-10-06 DIAGNOSIS — T07XXXA Unspecified multiple injuries, initial encounter: Secondary | ICD-10-CM

## 2014-10-06 DIAGNOSIS — M25551 Pain in right hip: Secondary | ICD-10-CM | POA: Diagnosis not present

## 2014-10-06 DIAGNOSIS — S4991XA Unspecified injury of right shoulder and upper arm, initial encounter: Secondary | ICD-10-CM | POA: Diagnosis present

## 2014-10-06 DIAGNOSIS — W010XXA Fall on same level from slipping, tripping and stumbling without subsequent striking against object, initial encounter: Secondary | ICD-10-CM | POA: Diagnosis not present

## 2014-10-06 DIAGNOSIS — S40011A Contusion of right shoulder, initial encounter: Secondary | ICD-10-CM | POA: Insufficient documentation

## 2014-10-06 LAB — CBC
HCT: 39.9 % (ref 36.0–46.0)
HEMOGLOBIN: 13 g/dL (ref 12.0–15.0)
MCH: 28.7 pg (ref 26.0–34.0)
MCHC: 32.6 g/dL (ref 30.0–36.0)
MCV: 88.1 fL (ref 78.0–100.0)
Platelets: 274 10*3/uL (ref 150–400)
RBC: 4.53 MIL/uL (ref 3.87–5.11)
RDW: 13.3 % (ref 11.5–15.5)
WBC: 8.9 10*3/uL (ref 4.0–10.5)

## 2014-10-06 LAB — I-STAT CHEM 8, ED
BUN: 10 mg/dL (ref 6–20)
CALCIUM ION: 1.2 mmol/L (ref 1.12–1.23)
Chloride: 108 mmol/L (ref 101–111)
Creatinine, Ser: 0.5 mg/dL (ref 0.44–1.00)
GLUCOSE: 94 mg/dL (ref 65–99)
HCT: 41 % (ref 36.0–46.0)
Hemoglobin: 13.9 g/dL (ref 12.0–15.0)
Potassium: 4.9 mmol/L (ref 3.5–5.1)
Sodium: 141 mmol/L (ref 135–145)
TCO2: 25 mmol/L (ref 0–100)

## 2014-10-06 LAB — I-STAT BETA HCG BLOOD, ED (MC, WL, AP ONLY): I-stat hCG, quantitative: <5 m[IU]/mL (ref <5)

## 2014-10-06 MED ORDER — ONDANSETRON HCL 4 MG/2ML IJ SOLN
4.0000 mg | Freq: Once | INTRAMUSCULAR | Status: AC
  Administered 2014-10-06: 4 mg via INTRAVENOUS
  Filled 2014-10-06: qty 2

## 2014-10-06 MED ORDER — OXYCODONE-ACETAMINOPHEN 5-325 MG PO TABS: 1.0000 | ORAL_TABLET | ORAL | Status: DC | PRN

## 2014-10-06 MED ORDER — IOHEXOL 300 MG/ML  SOLN
100.0000 mL | Freq: Once | INTRAMUSCULAR | Status: AC | PRN
  Administered 2014-10-06: 100 mL via INTRAVENOUS

## 2014-10-06 MED ORDER — MORPHINE SULFATE 4 MG/ML IJ SOLN
4.0000 mg | Freq: Once | INTRAMUSCULAR | Status: AC
  Administered 2014-10-06: 4 mg via INTRAVENOUS
  Filled 2014-10-06: qty 1

## 2014-10-06 NOTE — ED Notes (Signed)
Questions r/t dc were denied. Pt will use wheelchair to transport d/t immobilizer rle. Pt a&ox4

## 2014-10-06 NOTE — Discharge Instructions (Signed)

## 2014-10-06 NOTE — ED Notes (Signed)
Patient removed from LSB Patient c/o pain upper back and mid back.

## 2014-10-06 NOTE — ED Notes (Signed)
PER EMS - pt from home with c/o fall down 6 steps.  C/o RLE pain, neck/back pain, RUE pain  C-collar/LSB. RLE splinted.

## 2014-10-06 NOTE — ED Provider Notes (Signed)
MSE was initiated and I personally evaluated the patient and placed orders (if any) at  4:24 PM on October 06, 2014.  Whitney [name] is a 49 y.o. female brought in by ambulance, who presents to the Emergency Department with multiple injuries after mechanical fall down six stairs, one hour ago. Patient reports that she ambulates with a cane at baseline, but did not have it at time of fall and her leg "gave out." patient reports striking her head on the door at the end of the staircase, and impact to right shoulder. Patient additionally complains of lower back pain, R leg pain, dizziness, lightheadedness, nausea, and headache. Patient denies loss of consciousness.   Given the impact of the fall, and her hx of recurrent falls, and her scalp tenderness on exam, will move to the back for further eval.  The patient appears stable so that the remainder of the MSE may be completed by another provider.  Tavonna Worthington Camprubi-Soms, PA-C 10/06/14 1626  Eber Hong, MD 10/06/14 [phone]

## 2014-10-06 NOTE — ED Notes (Signed)
Bed: WTR6 Expected date:  Expected time:  Means of arrival:  Comments: EMS- 49yo F, fall, multiple complaints

## 2014-10-06 NOTE — ED Provider Notes (Signed)
CSN: [id number]     Arrival date & time 10/06/14  1602 History   First MD Initiated Contact with Patient 10/06/14 1618     Chief Complaint  Patient presents with  . Fall    down 6 steps, no LOC  . Extremity Pain    RUE, RLE s/p fall  . Neck Pain    and back s/p fall     (Consider location/radiation/quality/duration/timing/severity/associated sxs/prior Treatment) HPI Comments: The patient is a 49 year old female, she is Spanish-speaking, she reports that approximately one hour ago she had a fall down approximately 6 stairs, this was a trip and fall, she reports that her phone was ringing and she was trying to get it, she had just been in the shower, she normally ambulates with a cane, has a history of rheumatoid arthritis. She states that her leg gave out and she fell down the stairs, struck her head on the door at the end of the staircase, she had a contusion to the right shoulder as well as contusion to the right hip and lower extremity. She has no pain in the left arm and the left leg, no pain in her chest and has no difficulty breathing. Symptoms are persistent, transported by paramedics with immobilization with the cervical spine immobilizer and a backboard.  Patient is a 49 y.o. female presenting with fall, extremity pain, and neck pain. The history is provided by the patient.  Fall  Extremity Pain  Neck Pain   Past Medical History  Diagnosis Date  . Arthritis   . Hypoglycemia   . Hypertension   . Psoriatic arthritis   . Osteoporosis   . Snores    Past Surgical History  Procedure Laterality Date  . Knee surgery    . Joint replacement Left 5/14    knee  . Knee arthroscopy Right 06/08/2013    Procedure: RIGHT ARTHROSCOPY KNEE W/ PARTIAL MEDIAL & LATERAL MENISECTOMY & CHONDROPLASTY;  Surgeon: Velna Ochs, MD;  Location: [facility];  Service: Orthopedics;  Laterality: Right;  . Total knee arthroplasty Right 09/07/2013    Procedure: RIGHT TOTAL KNEE  ARTHROPLASTY;  Surgeon: Velna Ochs, MD;  Location: [facility];  Service: Orthopedics;  Laterality: Right;   No family history on file. History  Substance Use Topics  . Smoking status: Never Smoker   . Smokeless tobacco: Not on file  . Alcohol Use: No   OB History    No data available     Review of Systems  Musculoskeletal: Positive for neck pain.  All other systems reviewed and are negative.     Allergies  Ibuprofen; Tramadol; and Hydrocodone  Home Medications   Prior to Admission medications   Medication Sig Start Date End Date Taking? Authorizing Provider  aspirin EC 325 MG EC tablet Take 1 tablet (325 mg total) by mouth 2 (two) times daily after a meal. Patient taking differently: Take 325 mg by mouth every morning.  09/10/13  Yes Elodia Florence, PA-C  augmented betamethasone dipropionate (DIPROLENE-AF) 0.05 % cream Apply 1 application topically daily as needed (psoriasis). 04/30/13  Yes Carmelina Dane, MD  captopril (CAPOTEN) 50 MG tablet Take 1 tablet (50 mg total) by mouth 2 (two) times daily. 04/30/13  Yes Carmelina Dane, MD  folic acid (FOLVITE) 1 MG tablet Take 1 tablet (1 mg total) by mouth 2 (two) times daily. 04/30/13  Yes Carmelina Dane, MD  HUMIRA PEN 40 MG/0.8ML PNKT Inject 0.8 mLs as directed every 14 (fourteen)  days.  08/04/13  Yes Historical Provider, MD  methocarbamol (ROBAXIN) 500 MG tablet Take 1 tablet (500 mg total) by mouth every 6 (six) hours as needed for muscle spasms. 09/10/13  Yes Elodia Florence, PA-C  methotrexate (RHEUMATREX) 2.5 MG tablet Take 8 tablets (20 mg total) by mouth once a week. Each Friday Caution:Chemotherapy. Protect from light. 04/30/13  Yes Carmelina Dane, MD  ranitidine (ZANTAC) 300 MG tablet Take 300 mg by mouth daily.   Yes Historical Provider, MD  ondansetron (ZOFRAN ODT) 4 MG disintegrating tablet Take 1 tablet (4 mg total) by mouth every 8 (eight) hours as needed for nausea. Patient not taking: Reported on 10/06/2014 07/31/14    Eber Hong, MD  oxyCODONE-acetaminophen (PERCOCET) 5-325 MG per tablet Take 1 tablet by mouth every 4 (four) hours as needed. 10/06/14   Eber Hong, MD  sulfaDIAZINE 500 MG tablet Take 500 mg by mouth daily.    Historical Provider, MD   BP 128/74 mmHg  Pulse 61  Temp(Src) 98.1 F (36.7 C) (Oral)  Resp 16  SpO2 97%  LMP  Physical Exam  Constitutional: She appears well-developed and well-nourished. No distress.  HENT:  Head: Normocephalic.  Mouth/Throat: Oropharynx is clear and moist. No oropharyngeal exudate.  No malocclusion, no hemotympanum, no battle sign, no raccoon eyes, tenderness present over the right scalp  Eyes: Conjunctivae and EOM are normal. Pupils are equal, round, and reactive to light. Right eye exhibits no discharge. Left eye exhibits no discharge. No scleral icterus.  Neck: No JVD present. No thyromegaly present.  Cardiovascular: Normal rate, regular rhythm, normal heart sounds and intact distal pulses.  Exam reveals no gallop and no friction rub.   No murmur heard. Pulmonary/Chest: Effort normal and breath sounds normal. No respiratory distress. She has no wheezes. She has no rales. She exhibits no tenderness.  Abdominal: Soft. Bowel sounds are normal. She exhibits no distension and no mass. There is tenderness ( Right lower abdominal tenderness).  Musculoskeletal: Normal range of motion. She exhibits tenderness (  Tenderness to palpation of the right shoulder, decreased range of motion secondary to pain, no deformity.). She exhibits no edema.  Tenderness over the right hip, knee, femur, tib-fib, no obvious deformity, able to straight leg raise. Normal range of motion with supple joints and soft compartments in left arm and leg, soft compartments and right arm and leg  Lymphadenopathy:    She has no cervical adenopathy.  Neurological: She is alert. Coordination normal.  Skin: Skin is warm and dry. No rash noted. No erythema.  Psychiatric: She has a normal mood and  affect. Her behavior is normal.  Nursing note and vitals reviewed.   ED Course  Procedures (including critical care time) Labs Review Labs Reviewed  CBC  I-STAT CHEM 8, ED  I-STAT BETA HCG BLOOD, ED ([facility], [facility], [facility] ONLY)  I-STAT BETA HCG BLOOD, ED ([facility], [facility], [facility] ONLY)    Imaging Review Dg Shoulder Right  10/06/2014   CLINICAL DATA:  49 year old female status post fall with right shoulder pain  EXAM: RIGHT SHOULDER - 2+ VIEW  COMPARISON:  None.  FINDINGS: There is no evidence of fracture or dislocation. There is no evidence of arthropathy or other focal bone abnormality. Soft tissues are unremarkable.  IMPRESSION: No fracture or dislocation.   Electronically Signed   By: Elgie Collard M.D.   On: 10/06/2014 19:14   Dg Tibia/fibula Right  10/06/2014   CLINICAL DATA:  49 year old female status post fall with right knee pain  EXAM: RIGHT TIBIA AND FIBULA - 2 VIEW  COMPARISON:  Right knee radiograph dated 10/06/2014  FINDINGS: There is a total right knee arthroplasty in anatomic alignment. There is no acute fracture or dislocation. Small for corticated bony fragments noted along the posterior tibial plateau on the lateral projection which appears chronic. The soft are intact. No radiopaque foreign object identified.  IMPRESSION: No acute fracture or dislocation.   Electronically Signed   By: Elgie Collard M.D.   On: 10/06/2014 19:18   Ct Head Wo Contrast  10/06/2014   CLINICAL DATA:  Fall downstairs today with headaches and neck pain, initial encounter  EXAM: CT HEAD WITHOUT CONTRAST  CT CERVICAL SPINE WITHOUT CONTRAST  TECHNIQUE: Multidetector CT imaging of the head and cervical spine was performed following the standard protocol without intravenous contrast. Multiplanar CT image reconstructions of the cervical spine were also generated.  COMPARISON:  07/31/2014  FINDINGS: CT HEAD FINDINGS  The bony calvarium is intact. The ventricles are of normal size and configuration. No findings to suggest acute  hemorrhage, acute infarction or space-occupying mass lesion are noted.  CT CERVICAL SPINE FINDINGS  Seven cervical segments are well visualized. Vertebral body height is well maintained. Mild facet hypertrophic changes are noted. No acute fracture or acute facet abnormality is noted. The overall appearance is stable from the prior exam.  IMPRESSION: CT of the head:  No acute intracranial abnormality noted.  CT of the cervical spine: Mild degenerative change without acute abnormality.   Electronically Signed   By: Alcide Clever M.D.   On: 10/06/2014 19:30   Ct Cervical Spine Wo Contrast  10/06/2014   CLINICAL DATA:  Fall downstairs today with headaches and neck pain, initial encounter  EXAM: CT HEAD WITHOUT CONTRAST  CT CERVICAL SPINE WITHOUT CONTRAST  TECHNIQUE: Multidetector CT imaging of the head and cervical spine was performed following the standard protocol without intravenous contrast. Multiplanar CT image reconstructions of the cervical spine were also generated.  COMPARISON:  07/31/2014  FINDINGS: CT HEAD FINDINGS  The bony calvarium is intact. The ventricles are of normal size and configuration. No findings to suggest acute hemorrhage, acute infarction or space-occupying mass lesion are noted.  CT CERVICAL SPINE FINDINGS  Seven cervical segments are well visualized. Vertebral body height is well maintained. Mild facet hypertrophic changes are noted. No acute fracture or acute facet abnormality is noted. The overall appearance is stable from the prior exam.  IMPRESSION: CT of the head:  No acute intracranial abnormality noted.  CT of the cervical spine: Mild degenerative change without acute abnormality.   Electronically Signed   By: Alcide Clever M.D.   On: 10/06/2014 19:30   Ct Abdomen Pelvis W Contrast  10/06/2014   CLINICAL DATA:  49 year old female status post fall with multiple injuries.  EXAM: CT ABDOMEN AND PELVIS WITH CONTRAST  TECHNIQUE: Multidetector CT imaging of the abdomen and pelvis was  performed using the standard protocol following bolus administration of intravenous contrast.  CONTRAST:  OMNIPAQUE IOHEXOL 300 MG/ML  SOLN  COMPARISON:  CT dated 07/31/2014  FINDINGS: The visualized lung bases are clear.  No intra-abdominal free air or free fluid.  Mild diffuse hepatic steatosis. The gallbladder, pancreas, spleen, adrenal glands, kidneys, visualized ureters, and urinary bladder appear unremarkable. The uterus is anteverted and grossly unremarkable.  No evidence of bowel obstruction or inflammation. Normal appendix. Set the visualized abdominal aorta and IVC appear unremarkable. No portal venous gas identified. There is no lymphadenopathy. Small fat containing umbilical  hernia. The soft tissues of the abdominal wall appear unremarkable. There is degenerative changes of the spine. No acute fracture. T6 hemangioma.  IMPRESSION: No acute/traumatic intra-abdominal or pelvic pathology.   Electronically Signed   By: Elgie Collard M.D.   On: 10/06/2014 19:31   Dg Knee Complete 4 Views Right  10/06/2014   CLINICAL DATA:  Fall down steps with knee pain, initial encounter  EXAM: RIGHT KNEE - COMPLETE 4+ VIEW  COMPARISON:  None.  FINDINGS: A right knee prosthesis is noted. No loosening or dislocation is seen. No acute fracture is noted. No joint effusion is noted.  IMPRESSION: No acute abnormality seen.   Electronically Signed   By: Alcide Clever M.D.   On: 10/06/2014 19:16   Dg Hip Unilat With Pelvis 2-3 Views Right  10/06/2014   CLINICAL DATA:  49 year old female status post fall with right hip pain  EXAM: DG HIP (WITH OR WITHOUT PELVIS) 2-3V RIGHT  COMPARISON:  Right hip radiograph dated 10/06/2014 Set Edit  FINDINGS: There is no evidence of hip fracture or dislocation. There is no evidence of arthropathy or other focal bone abnormality.  IMPRESSION: No acute fracture or dislocation.   Electronically Signed   By: Elgie Collard M.D.   On: 10/06/2014 19:16   Dg Femur, Min 2 Views  Right  10/06/2014   CLINICAL DATA:  Fall down steps today with right leg pain, initial encounter  EXAM: RIGHT FEMUR 2 VIEWS  COMPARISON:  07/31/2014  FINDINGS: A right knee prosthesis is noted. No acute bony abnormality is seen. No soft tissue abnormality is noted.  IMPRESSION: No acute abnormality noted.   Electronically Signed   By: Alcide Clever M.D.   On: 10/06/2014 19:16     MDM   Final diagnoses:  Head injuries, initial encounter  Contusion of multiple sites    The patient has good pulses at the feet, obvious deformities, complains of multiple sites of pain after a fall down 6 stairs. We'll obtain imaging, given pain medication, repeat evaluation after imaging is completed. She does not appear to be in any cardiovascular distress.  Labs and imaging neg - pt well appearing, C collar removed, stable for d/c.  Meds given in ED:  Medications  morphine 4 MG/ML injection 4 mg (4 mg Intravenous Given 10/06/14 1732)  ondansetron (ZOFRAN) injection 4 mg (4 mg Intravenous Given 10/06/14 1732)  iohexol (OMNIPAQUE) 300 MG/ML solution 100 mL (100 mLs Intravenous Contrast Given 10/06/14 1923)    New Prescriptions   OXYCODONE-ACETAMINOPHEN (PERCOCET) 5-325 MG PER TABLET    Take 1 tablet by mouth every 4 (four) hours as needed.      Eber Hong, MD 10/06/14 2150

## 2014-12-29 ENCOUNTER — Encounter (HOSPITAL_COMMUNITY): Payer: Self-pay

## 2014-12-29 ENCOUNTER — Emergency Department (HOSPITAL_COMMUNITY)
Admission: EM | Admit: 2014-12-29 | Discharge: 2014-12-29 | Disposition: A | Discharge status: Home / Self Care | Payer: Medicare Other | Attending: Emergency Medicine | Admitting: Emergency Medicine

## 2014-12-29 DIAGNOSIS — I10 Essential (primary) hypertension: Secondary | ICD-10-CM | POA: Insufficient documentation

## 2014-12-29 DIAGNOSIS — M1990 Unspecified osteoarthritis, unspecified site: Secondary | ICD-10-CM | POA: Diagnosis not present

## 2014-12-29 DIAGNOSIS — G8929 Other chronic pain: Secondary | ICD-10-CM | POA: Diagnosis not present

## 2014-12-29 DIAGNOSIS — Z79899 Other long term (current) drug therapy: Secondary | ICD-10-CM | POA: Diagnosis not present

## 2014-12-29 DIAGNOSIS — M542 Cervicalgia: Secondary | ICD-10-CM | POA: Insufficient documentation

## 2014-12-29 DIAGNOSIS — M549 Dorsalgia, unspecified: Secondary | ICD-10-CM

## 2014-12-29 DIAGNOSIS — Z3202 Encounter for pregnancy test, result negative: Secondary | ICD-10-CM | POA: Diagnosis not present

## 2014-12-29 DIAGNOSIS — Z7982 Long term (current) use of aspirin: Secondary | ICD-10-CM | POA: Diagnosis not present

## 2014-12-29 DIAGNOSIS — M545 Low back pain: Secondary | ICD-10-CM | POA: Diagnosis not present

## 2014-12-29 LAB — URINALYSIS, ROUTINE W REFLEX MICROSCOPIC
Bilirubin Urine: NEGATIVE
Glucose, UA: NEGATIVE mg/dL
HGB URINE DIPSTICK: NEGATIVE
Ketones, ur: NEGATIVE mg/dL
Leukocytes, UA: NEGATIVE
Nitrite: NEGATIVE
PH: 5.5 (ref 5.0–8.0)
Protein, ur: NEGATIVE mg/dL
SPECIFIC GRAVITY, URINE: 1.031 — AB (ref 1.005–1.030)
Urobilinogen, UA: 1 mg/dL (ref 0.0–1.0)

## 2014-12-29 LAB — PREGNANCY, URINE: Preg Test, Ur: NEGATIVE

## 2014-12-29 MED ORDER — OXYCODONE-ACETAMINOPHEN 5-325 MG PO TABS
1.0000 | ORAL_TABLET | Freq: Once | ORAL | Status: AC
  Administered 2014-12-29: 1 via ORAL
  Filled 2014-12-29: qty 1

## 2014-12-29 NOTE — ED Notes (Signed)
Translator phone was used for discharge instructions; pt became very upset with being discharged and not given Rx for pain;  Pt states understanding that she has chronic pain and needs establish primary care; Pt was given resource for [employer] [facility] and wellness; Md was consulted again by RN with pain concerns that she is still in severe pain; Md gave order for RN to discharge that pt has been seen multiple times for the same chronic pain and there is nothing further to be done and pt is not in any danger or emergent situation; pt was not happy with result and threw discharge paper work on [employer] station and refused for RN to help escort or wheel her to exit; Pt was observed by RN heading to exit holding on to walls and railings as she walked; Pt refused for RN to assist. Pt also refused to sign as discharge

## 2014-12-29 NOTE — ED Notes (Signed)
Pt states she started having back pain 3 days ago and it became severe tonight; pt describes pain as starting at base of neck and going downs sides into lower abdomen; Pt states hs of arthritis; Pt a&ox 4 on arrival. Pt speaks spanish; Translator phone in use; Pt states she uses a walker but walker was taking away; pt states she was at the store and stopped her self from fall and believes she may have hurt her right knee; pt states hx of bilateral knee replacement

## 2014-12-29 NOTE — ED Provider Notes (Signed)
CSN: [id number]     Arrival date & time 12/29/14  0413 History   First MD Initiated Contact with Patient 12/29/14 0434     Chief Complaint  Patient presents with  . Back Pain     Patient is a 49 y.o. female presenting with back pain. The history is provided by the patient. A language interpreter was used [phone]).  Back Pain Location:  Generalized Quality:  Aching Pain severity:  Moderate Onset quality:  Gradual Timing:  Constant Progression:  Worsening Chronicity:  Recurrent Context: falling   Relieved by:  Lying down Worsened by:  Movement Associated symptoms: abdominal pain and weakness   Associated symptoms: no bladder incontinence, no bowel incontinence, no chest pain and no fever   Pt reports she has neck and back pain on a daily basis She reports she was at the store earlier and was not using her walker (uses walker due to knee pain from previous knee replacement) She fell due to not having walker and caught herself but this worsened her neck and back pain She feels her legs are weaker than normal No incontinence She reports mild low abd pain No h/o neck or back surgery  Past Medical History  Diagnosis Date  . Arthritis   . Hypoglycemia   . Hypertension   . Psoriatic arthritis (HCC)   . Osteoporosis   . Snores    Past Surgical History  Procedure Laterality Date  . Knee surgery    . Joint replacement Left 5/14    knee  . Knee arthroscopy Right 06/08/2013    Procedure: RIGHT ARTHROSCOPY KNEE W/ PARTIAL MEDIAL & LATERAL MENISECTOMY & CHONDROPLASTY;  Surgeon: Velna Ochs, MD;  Location: [facility];  Service: Orthopedics;  Laterality: Right;  . Total knee arthroplasty Right 09/07/2013    Procedure: RIGHT TOTAL KNEE ARTHROPLASTY;  Surgeon: Velna Ochs, MD;  Location: [facility];  Service: Orthopedics;  Laterality: Right;   No family history on file. Social History  Substance Use Topics  . Smoking status: Never Smoker   . Smokeless tobacco: None   . Alcohol Use: No   OB History    No data available     Review of Systems  Constitutional: Negative for fever.  Respiratory: Negative for shortness of breath.   Cardiovascular: Negative for chest pain.  Gastrointestinal: Positive for abdominal pain. Negative for bowel incontinence.  Genitourinary: Negative for bladder incontinence.  Musculoskeletal: Positive for myalgias, back pain, arthralgias and neck pain.  Neurological: Positive for weakness.  All other systems reviewed and are negative.     Allergies  Ibuprofen; Tramadol; and Hydrocodone  Home Medications   Prior to Admission medications   Medication Sig Start Date End Date Taking? Authorizing Provider  aspirin EC 325 MG EC tablet Take 1 tablet (325 mg total) by mouth 2 (two) times daily after a meal. Patient taking differently: Take 325 mg by mouth every morning.  09/10/13   Elodia Florence, PA-C  augmented betamethasone dipropionate (DIPROLENE-AF) 0.05 % cream Apply 1 application topically daily as needed (psoriasis). 04/30/13   Carmelina Dane, MD  captopril (CAPOTEN) 50 MG tablet Take 1 tablet (50 mg total) by mouth 2 (two) times daily. 04/30/13   Carmelina Dane, MD  folic acid (FOLVITE) 1 MG tablet Take 1 tablet (1 mg total) by mouth 2 (two) times daily. 04/30/13   Carmelina Dane, MD  HUMIRA PEN 40 MG/0.8ML PNKT Inject 0.8 mLs as directed every 14 (fourteen) days.  08/04/13  Historical Provider, MD  methocarbamol (ROBAXIN) 500 MG tablet Take 1 tablet (500 mg total) by mouth every 6 (six) hours as needed for muscle spasms. 09/10/13   Elodia Florence, PA-C  methotrexate (RHEUMATREX) 2.5 MG tablet Take 8 tablets (20 mg total) by mouth once a week. Each Friday Caution:Chemotherapy. Protect from light. 04/30/13   Carmelina Dane, MD  ondansetron (ZOFRAN ODT) 4 MG disintegrating tablet Take 1 tablet (4 mg total) by mouth every 8 (eight) hours as needed for nausea. Patient not taking: Reported on 10/06/2014 07/31/14   Eber Hong,  MD  oxyCODONE-acetaminophen (PERCOCET) 5-325 MG per tablet Take 1 tablet by mouth every 4 (four) hours as needed. 10/06/14   Eber Hong, MD  ranitidine (ZANTAC) 300 MG tablet Take 300 mg by mouth daily.    Historical Provider, MD  sulfaDIAZINE 500 MG tablet Take 500 mg by mouth daily.    Historical Provider, MD   BP 107/58 mmHg  Pulse 58  Temp(Src) 98.6 F (37 C) (Oral)  Resp 20  Ht 5\' 2"  (1.575 m)  Wt 206 lb (93.441 kg)  BMI 37.67 kg/m2  SpO2 100% Physical Exam CONSTITUTIONAL: Well developed/well nourished HEAD: Normocephalic/atraumatic EYES: EOMI/PERRL ENMT: Mucous membranes moist NECK: supple no meningeal signs SPINE/BACK:she has diffuse tenderness CTL spine and paraspinal region.  No bruising noted CV: S1/S2 noted, no murmurs/rubs/gallops noted LUNGS: Lungs are clear to auscultation bilaterally, no apparent distress ABDOMEN: soft, nontender, no rebound or guarding NEURO: Awake/alert,equal motor noted with the following: hip flexion/knee flexion/extension, foot dorsi/plantar flexion, great toe extension intact bilaterally,   Equal patellar/achilles reflex noted in bilateral lower extremities.  Pt is able to ambulate  EXTREMITIES: pulses normal, full ROM SKIN: warm, color normal PSYCH: no abnormalities of mood noted, alert and oriented to situation   ED Course  Procedures  Pt here with worsening neck/back pain after fall at store as she did not have walker She is diffusely tender to neck/back but no signs of trauma and she did not fall on her back I suspect acute on chronic pain Will check u/a as pt reports some suprapubic pain No signs of any acute spinal pathology 6:42 AM Pt stable She admits that this pain is chronic on a daily basis  Advised need for PCP evaluation I utilized interpreter and advised need for PCP evaluation, as well as pain management We discussed strict ER return precautions   Labs Review Labs Reviewed  URINALYSIS, ROUTINE W REFLEX MICROSCOPIC  (NOT AT [facility]) - Abnormal; Notable for the following:    Specific Gravity, Urine 1.031 (*)    All other components within normal limits  PREGNANCY, URINE    I have personally reviewed and evaluated these lab results as part of my medical decision-making.    MDM   Final diagnoses:  Chronic pain  Neck pain  Back pain, unspecified location    Nursing notes including past medical history and social history reviewed and considered in documentation Labs/vital reviewed myself and considered during evaluation     [facility], MD 12/29/14 [phone]

## 2015-03-10 ENCOUNTER — Emergency Department (HOSPITAL_COMMUNITY)
Admission: EM | Admit: 2015-03-10 | Discharge: 2015-03-10 | Disposition: A | Discharge status: Home / Self Care | Payer: Medicare Other | Attending: Emergency Medicine | Admitting: Emergency Medicine

## 2015-03-10 ENCOUNTER — Emergency Department (HOSPITAL_COMMUNITY): Payer: Medicare Other

## 2015-03-10 ENCOUNTER — Encounter (HOSPITAL_COMMUNITY): Payer: Self-pay | Admitting: Emergency Medicine

## 2015-03-10 DIAGNOSIS — S6991XA Unspecified injury of right wrist, hand and finger(s), initial encounter: Secondary | ICD-10-CM | POA: Diagnosis present

## 2015-03-10 DIAGNOSIS — Y9389 Activity, other specified: Secondary | ICD-10-CM | POA: Insufficient documentation

## 2015-03-10 DIAGNOSIS — Z79899 Other long term (current) drug therapy: Secondary | ICD-10-CM | POA: Insufficient documentation

## 2015-03-10 DIAGNOSIS — M1990 Unspecified osteoarthritis, unspecified site: Secondary | ICD-10-CM | POA: Diagnosis not present

## 2015-03-10 DIAGNOSIS — Y998 Other external cause status: Secondary | ICD-10-CM | POA: Diagnosis not present

## 2015-03-10 DIAGNOSIS — Z8639 Personal history of other endocrine, nutritional and metabolic disease: Secondary | ICD-10-CM | POA: Insufficient documentation

## 2015-03-10 DIAGNOSIS — S29012A Strain of muscle and tendon of back wall of thorax, initial encounter: Secondary | ICD-10-CM | POA: Insufficient documentation

## 2015-03-10 DIAGNOSIS — S60221A Contusion of right hand, initial encounter: Secondary | ICD-10-CM | POA: Insufficient documentation

## 2015-03-10 DIAGNOSIS — W231XXA Caught, crushed, jammed, or pinched between stationary objects, initial encounter: Secondary | ICD-10-CM | POA: Diagnosis not present

## 2015-03-10 DIAGNOSIS — Z7982 Long term (current) use of aspirin: Secondary | ICD-10-CM | POA: Insufficient documentation

## 2015-03-10 DIAGNOSIS — Y9259 Other trade areas as the place of occurrence of the external cause: Secondary | ICD-10-CM | POA: Insufficient documentation

## 2015-03-10 DIAGNOSIS — Z872 Personal history of diseases of the skin and subcutaneous tissue: Secondary | ICD-10-CM | POA: Insufficient documentation

## 2015-03-10 DIAGNOSIS — I10 Essential (primary) hypertension: Secondary | ICD-10-CM | POA: Diagnosis not present

## 2015-03-10 DIAGNOSIS — S39012A Strain of muscle, fascia and tendon of lower back, initial encounter: Secondary | ICD-10-CM

## 2015-03-10 MED ORDER — ACETAMINOPHEN 325 MG PO TABS
650.0000 mg | ORAL_TABLET | Freq: Once | ORAL | Status: AC
  Administered 2015-03-10: 650 mg via ORAL
  Filled 2015-03-10: qty 2

## 2015-03-10 MED ORDER — CYCLOBENZAPRINE HCL 10 MG PO TABS
5.0000 mg | ORAL_TABLET | Freq: Once | ORAL | Status: AC
  Administered 2015-03-10: 5 mg via ORAL
  Filled 2015-03-10: qty 1

## 2015-03-10 MED ORDER — METHOCARBAMOL 500 MG PO TABS: 500.0000 mg | ORAL_TABLET | Freq: Four times a day (QID) | ORAL | Status: AC | PRN

## 2015-03-10 NOTE — Progress Notes (Signed)
Orthopedic Tech Progress Note Patient Details:  Whitney [name] 1966/02/12 [id number]  Ortho Devices Type of Ortho Device: Ace wrap, Arm sling, Ulna gutter splint Ortho Device/Splint Location: RUE Ortho Device/Splint Interventions: Ordered, Application   Jennye Moccasin 03/10/2015, 8:48 PM

## 2015-03-10 NOTE — Discharge Instructions (Signed)
Take tylenol for pain.  Take robaxin for muscle strain.  Use splint and sling for comfort. Please make sure that you don't use it all the time. Please try to move your hand and arm around as much as possible.   See your doctor.   Return to ER if you have severe pain, weakness, trouble walking.
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
## 2015-07-19 ENCOUNTER — Ambulatory Visit: Payer: Medicare Other | Admitting: Family Medicine

## 2015-07-19 DIAGNOSIS — Z0289 Encounter for other administrative examinations: Secondary | ICD-10-CM

## 2015-07-25 ENCOUNTER — Ambulatory Visit: Payer: Medicare Other | Attending: Family Medicine | Admitting: Family Medicine

## 2015-07-25 ENCOUNTER — Encounter: Payer: Self-pay | Admitting: Family Medicine

## 2015-07-25 VITALS — BP 106/76 | HR 69 | Temp 97.4°F | Resp 16 | Ht 62.0 in | Wt 205.0 lb

## 2015-07-25 DIAGNOSIS — G43909 Migraine, unspecified, not intractable, without status migrainosus: Secondary | ICD-10-CM | POA: Diagnosis not present

## 2015-07-25 DIAGNOSIS — G8929 Other chronic pain: Secondary | ICD-10-CM

## 2015-07-25 DIAGNOSIS — M1990 Unspecified osteoarthritis, unspecified site: Secondary | ICD-10-CM | POA: Insufficient documentation

## 2015-07-25 DIAGNOSIS — Z7982 Long term (current) use of aspirin: Secondary | ICD-10-CM | POA: Diagnosis not present

## 2015-07-25 DIAGNOSIS — L4050 Arthropathic psoriasis, unspecified: Secondary | ICD-10-CM | POA: Insufficient documentation

## 2015-07-25 DIAGNOSIS — Z79899 Other long term (current) drug therapy: Secondary | ICD-10-CM | POA: Insufficient documentation

## 2015-07-25 DIAGNOSIS — Z9889 Other specified postprocedural states: Secondary | ICD-10-CM | POA: Diagnosis not present

## 2015-07-25 DIAGNOSIS — I10 Essential (primary) hypertension: Secondary | ICD-10-CM | POA: Diagnosis not present

## 2015-07-25 DIAGNOSIS — N910 Primary amenorrhea: Secondary | ICD-10-CM | POA: Diagnosis not present

## 2015-07-25 DIAGNOSIS — Z96651 Presence of right artificial knee joint: Secondary | ICD-10-CM | POA: Insufficient documentation

## 2015-07-25 MED ORDER — MOMETASONE FUROATE 0.1 % EX CREA: 1.0000 "application " | TOPICAL_CREAM | Freq: Every day | CUTANEOUS | Status: AC

## 2015-07-25 MED ORDER — PROPRANOLOL HCL ER 60 MG PO CP24: 60.0000 mg | ORAL_CAPSULE | Freq: Every day | ORAL | Status: AC

## 2015-07-25 MED ORDER — PREDNISONE 20 MG PO TABS: 40.0000 mg | ORAL_TABLET | Freq: Every day | ORAL | Status: AC

## 2015-07-25 NOTE — Progress Notes (Signed)
LOGO@  Subjective:  Patient ID: Whitney [name], female    DOB: 1965/04/26  Age: 50 y.o. MRN: [id number]  CC: Establish Care and Hypertension  Spanish interpreter used   HPI Whitney [name] presents for    1. Migraine: dx at age 58. Controlled with propranolol ER 60 mg daily in the past. Has not had propranolol in many months. Has intermittent headaches.   2. Psoriatic arthritis: she missed her last rheumatology appointment and does not have the $100 no show fee. She was unable to re-schedule the appointment. She last saw the rheumatologist 3-4 months ago. She has ran out of methotrexate, humira, percocet, folic acid 3 months ago. She has pain in her hands, shoulders, knees and back. She has tried and failed tramadol, ibuprofen, tylenol #3 and #4 and vicodin.  She has an appointment with [facility] on 10/05/2015 at 10:15 AM for spinal injection but she is afraid of this.   3. HM: last pap was one year ago. Here in [place].   Past Medical History  Diagnosis Date  . Arthritis   . Hypoglycemia   . Hypertension   . Psoriatic arthritis (HCC) 03/2013  . Osteoporosis   . Snores     Past Surgical History  Procedure Laterality Date  . Knee surgery    . Joint replacement Left 5/14    knee  . Knee arthroscopy Right 06/08/2013    Procedure: RIGHT ARTHROSCOPY KNEE W/ PARTIAL MEDIAL & LATERAL MENISECTOMY & CHONDROPLASTY;  Surgeon: Velna Ochs, MD;  Location: [facility];  Service: Orthopedics;  Laterality: Right;  . Total knee arthroplasty Right 09/07/2013    Procedure: RIGHT TOTAL KNEE ARTHROPLASTY;  Surgeon: Velna Ochs, MD;  Location: [facility];  Service: Orthopedics;  Laterality: Right;    No family history on file.  Social History  Substance Use Topics  . Smoking status: Never Smoker   . Smokeless tobacco: Not on file  . Alcohol Use: No    ROS Review of Systems  Constitutional: Negative for fever and chills.  Eyes: Negative for visual disturbance.   Respiratory: Negative for shortness of breath.   Cardiovascular: Negative for chest pain.  Gastrointestinal: Negative for abdominal pain and blood in stool.  Genitourinary: Positive for menstrual problem (reports never having a period ).  Musculoskeletal: Positive for arthralgias. Negative for back pain.  Skin: Positive for rash.  Allergic/Immunologic: Negative for immunocompromised state.  Neurological: Positive for headaches.  Hematological: Negative for adenopathy. Does not bruise/bleed easily.  Psychiatric/Behavioral: Negative for suicidal ideas and dysphoric mood.    Objective:   Today's Vitals: BP 106/76 mmHg  Pulse 69  Temp(Src) 97.4 F (36.3 C) (Oral)  Resp 16  Ht 5\' 2"  (1.575 m)  Wt 205 lb (92.987 kg)  BMI 37.49 kg/m2  SpO2 96% BP Readings from Last 3 Encounters:  07/25/15 106/76  03/10/15 132/81  12/29/14 107/58    Physical Exam  Constitutional: She is oriented to person, place, and time. She appears well-developed and well-nourished. No distress.  HENT:  Head: Normocephalic and atraumatic.  Cardiovascular: Normal rate, regular rhythm, normal heart sounds and intact distal pulses.   Pulmonary/Chest: Effort normal and breath sounds normal.  Musculoskeletal: She exhibits no edema.  Healed surgical scars on both knees   Neurological: She is alert and oriented to person, place, and time.  Skin: Skin is warm and dry. No rash noted.  Psychiatric: She has a normal mood and affect.   Assessment & Plan:   Problem List Items Addressed  This Visit    Psoriatic arthritis (HCC) (Chronic)   Relevant Medications   mometasone (ELOCON) 0.1 % cream   predniSONE (DELTASONE) 20 MG tablet   Other Relevant Orders   Ambulatory referral to Rheumatology   Primary amenorrhea (Chronic)   Migraine - Primary (Chronic)   Relevant Medications   propranolol ER (INDERAL LA) 60 MG 24 hr capsule    Other Visit Diagnoses    Chronic pain        Relevant Medications    predniSONE  (DELTASONE) 20 MG tablet    Other Relevant Orders    Ambulatory referral to Pain Clinic       Outpatient Encounter Prescriptions as of 07/25/2015  Medication Sig  . aspirin EC 325 MG EC tablet Take 1 tablet (325 mg total) by mouth 2 (two) times daily after a meal. (Patient taking differently: Take 325 mg by mouth every morning. )  . augmented betamethasone dipropionate (DIPROLENE-AF) 0.05 % cream Apply 1 application topically daily as needed (psoriasis).  . captopril (CAPOTEN) 50 MG tablet Take 1 tablet (50 mg total) by mouth 2 (two) times daily.  . folic acid (FOLVITE) 1 MG tablet Take 1 tablet (1 mg total) by mouth 2 (two) times daily.  Marland Kitchen HUMIRA PEN 40 MG/0.8ML PNKT Inject 40 mg as directed once a week.   . methocarbamol (ROBAXIN) 500 MG tablet Take 1 tablet (500 mg total) by mouth every 6 (six) hours as needed for muscle spasms.  . methotrexate (RHEUMATREX) 2.5 MG tablet Take 8 tablets (20 mg total) by mouth once a week. Each Friday Caution:Chemotherapy. Protect from light.  . ondansetron (ZOFRAN ODT) 4 MG disintegrating tablet Take 1 tablet (4 mg total) by mouth every 8 (eight) hours as needed for nausea.  Marland Kitchen oxyCODONE-acetaminophen (PERCOCET) 10-325 MG tablet Take 1 tablet by mouth every 4 (four) hours as needed for pain.  Marland Kitchen oxyCODONE-acetaminophen (PERCOCET) 5-325 MG per tablet Take 1 tablet by mouth every 4 (four) hours as needed. (Patient not taking: Reported on 12/29/2014)  . ranitidine (ZANTAC) 300 MG tablet Take 300 mg by mouth daily.  Marland Kitchen sulfaDIAZINE 500 MG tablet Take 500 mg by mouth daily.   No facility-administered encounter medications on file as of 07/25/2015.    Follow-up: No Follow-up on file.    Dessa Phi MD

## 2015-07-25 NOTE — Progress Notes (Signed)
Establish care  Hx migraine  Stated not taking medication x 1 month  Pain scale #8 Migraine -body ache No tobacco user  No suicidal thought in the past two weeks

## 2015-07-25 NOTE — Patient Instructions (Addendum)
Whitney [name] was seen today for establish care and hypertension.  Diagnoses and all orders for this visit:  Migraine without status migrainosus, not intractable, unspecified migraine type -     propranolol ER (INDERAL LA) 60 MG 24 hr capsule; Take 1 capsule (60 mg total) by mouth at bedtime.  Psoriatic arthritis (HCC) -     Ambulatory referral to Rheumatology -     mometasone (ELOCON) 0.1 % cream; Apply 1 application topically daily. -     predniSONE (DELTASONE) 20 MG tablet; Take 2 tablets (40 mg total) by mouth daily with breakfast.  Primary amenorrhea  Chronic pain -     Ambulatory referral to Pain Clinic    F/u in 4 weeks for BP and pulse check since restarting propranolol  Dr. Armen Pickup

## 2015-07-27 NOTE — Assessment & Plan Note (Signed)
Hx of migraine that improved with propranolol  Restart propranolol Close f/u for BP and HR check

## 2015-07-27 NOTE — Assessment & Plan Note (Signed)
A: psoriatic arthritis with pain, no significant joint swelling or rash on exam. Patient requested percocet. She was informed of clinic prescribing policy for narcotics (tramadol or tylenol #3 only).  P: Short course of prednisone for pain elecon for rash Referral to rheumatology

## 2015-08-07 ENCOUNTER — Telehealth: Payer: Self-pay | Admitting: Family Medicine

## 2015-08-07 NOTE — Telephone Encounter (Signed)
Patient called states [facility] has sent 2nd medication request for (glucometer) with no response, patient would like to provide fax number [phone].

## 2015-08-07 NOTE — Telephone Encounter (Signed)
Please call patient There is no diagnosis of diabetes on her chart and she is not on any meds for diabetes  There is no need for a meter, insurance will not cover a meter and I cannot write an Rx for a meter without a diagnosis of diabetes.

## 2015-08-08 NOTE — Telephone Encounter (Signed)
I see there is note of a history of low sugars, but there are no low sugars documented in her chart   Please also notify patient that she has a rheumatology appointment on 08/22/2015 at 10:45 AM please arrive 15 mins early  [facility]  [place]  Address: [address], [place], [place] [address]  Phone: [phone]

## 2015-08-09 NOTE — Telephone Encounter (Signed)
Pt aware of Rheumatology appointment  Stated has Hx low sugar. Was given a glucometer and glucose tablets in the past  Advised to request medical record form preview PCP

## 2015-08-22 ENCOUNTER — Ambulatory Visit: Payer: Medicare Other | Admitting: Family Medicine

## 2015-10-03 ENCOUNTER — Telehealth: Payer: Self-pay | Admitting: Family Medicine

## 2015-10-03 NOTE — Telephone Encounter (Signed)
Whitney [name] from [facility] called stating that she received a Rx and it stated that the Pt. Is not diabetic.  Rep stated that she spoke with the pt. And she had stated that she is diabetic. Please f/u

## 2015-10-04 NOTE — Telephone Encounter (Signed)
Will forward to [place] she spoke with rep

## 2015-11-02 ENCOUNTER — Telehealth: Payer: Self-pay | Admitting: Family Medicine

## 2015-11-02 NOTE — Telephone Encounter (Signed)
Shanda Bumps from [facility] calling about a Rx that was sent stating that the pt is not diabetic  States the pt has been receiving supplies from company for four years. States pt was diagnosed as diabetic in 2013  Requesting to be followed up with at [phone] for clarification

## 2015-11-06 NOTE — Telephone Encounter (Signed)
Patient did not provide any history of diabetes when she came for her one and only new patient OV with me. She reported a hx of "hypoglycemia".   Please call patient to f/u for ? Diabetes and to come in to sign a release for her previous medical records. I cannot attest to a diagnosis that I have no history of.

## 2015-11-08 NOTE — Telephone Encounter (Signed)
Left a message on patients voicemail for her to return the phone call.

## 2016-08-11 IMAGING — CR DG KNEE COMPLETE 4+V*R*
5 series · 5 of 5 positions shown · non-contrast
Comparison: 09/11/2013

CLINICAL DATA: Slipped and fell in driveway, RIGHT knee pain, RIGHT
knee replacement 5 months ago

EXAM:
RIGHT KNEE - COMPLETE 4+ VIEW

[x knee ap right (1 of 3)]
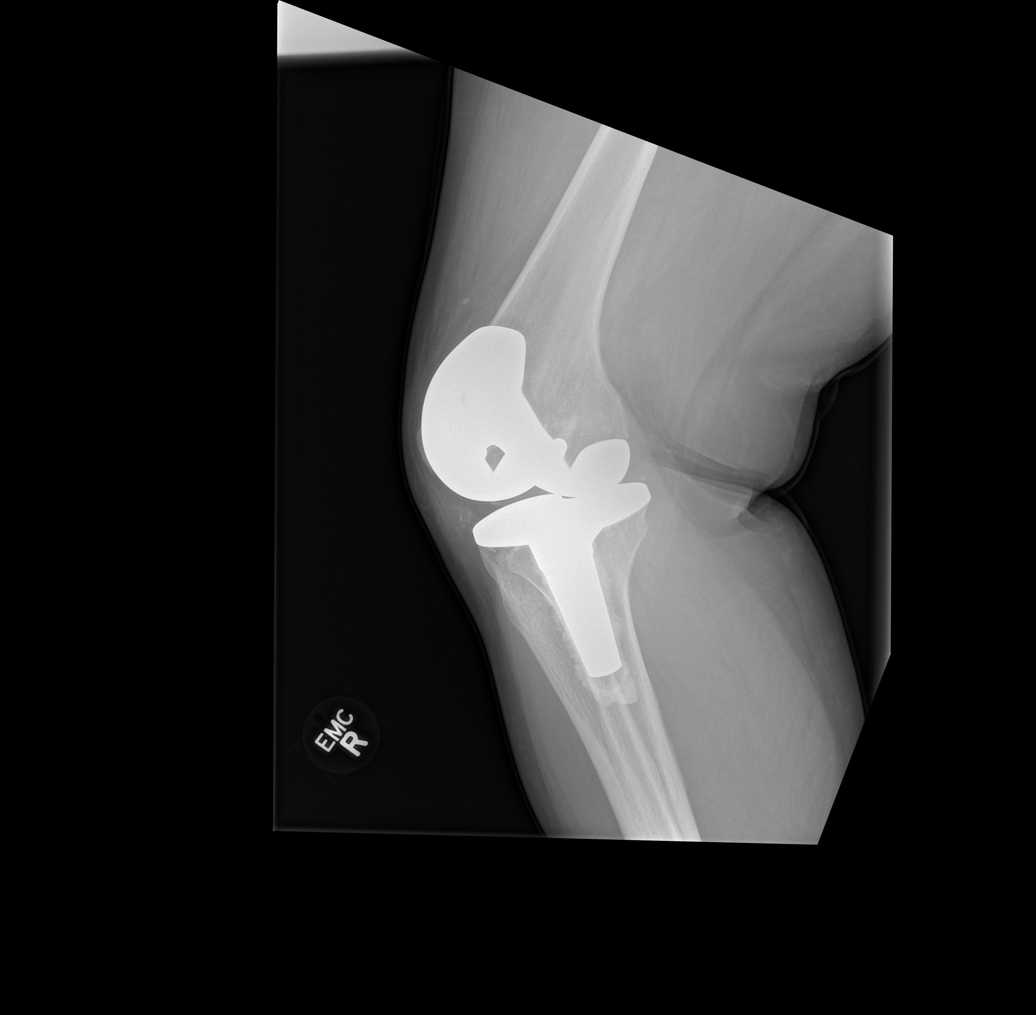

[x knee obl right (1 of 2)]
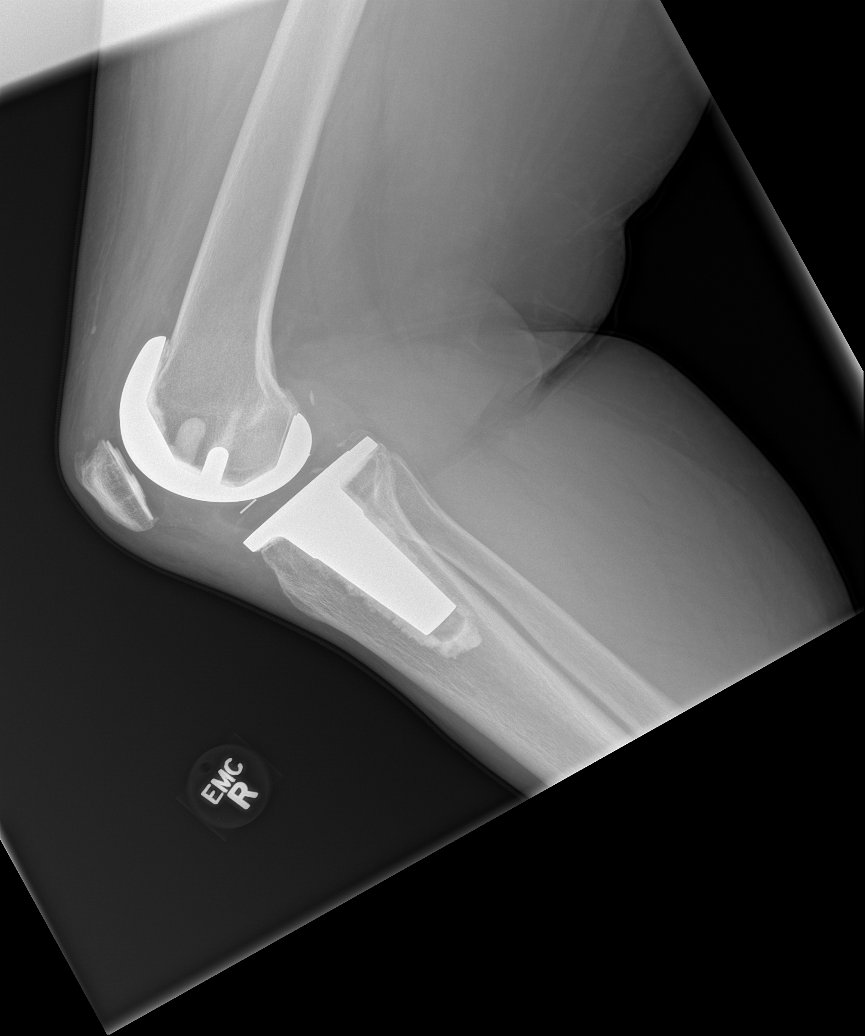

[x knee obl right (2 of 2)]
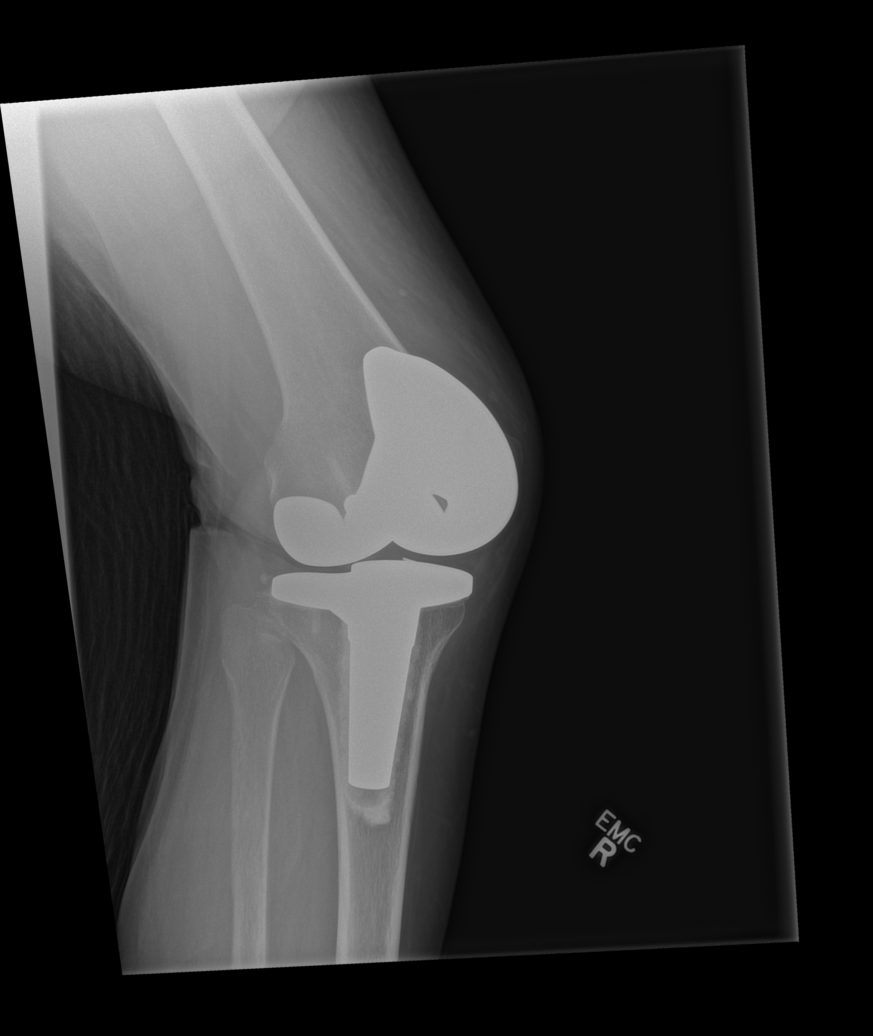

[x knee ap right (2 of 3)]
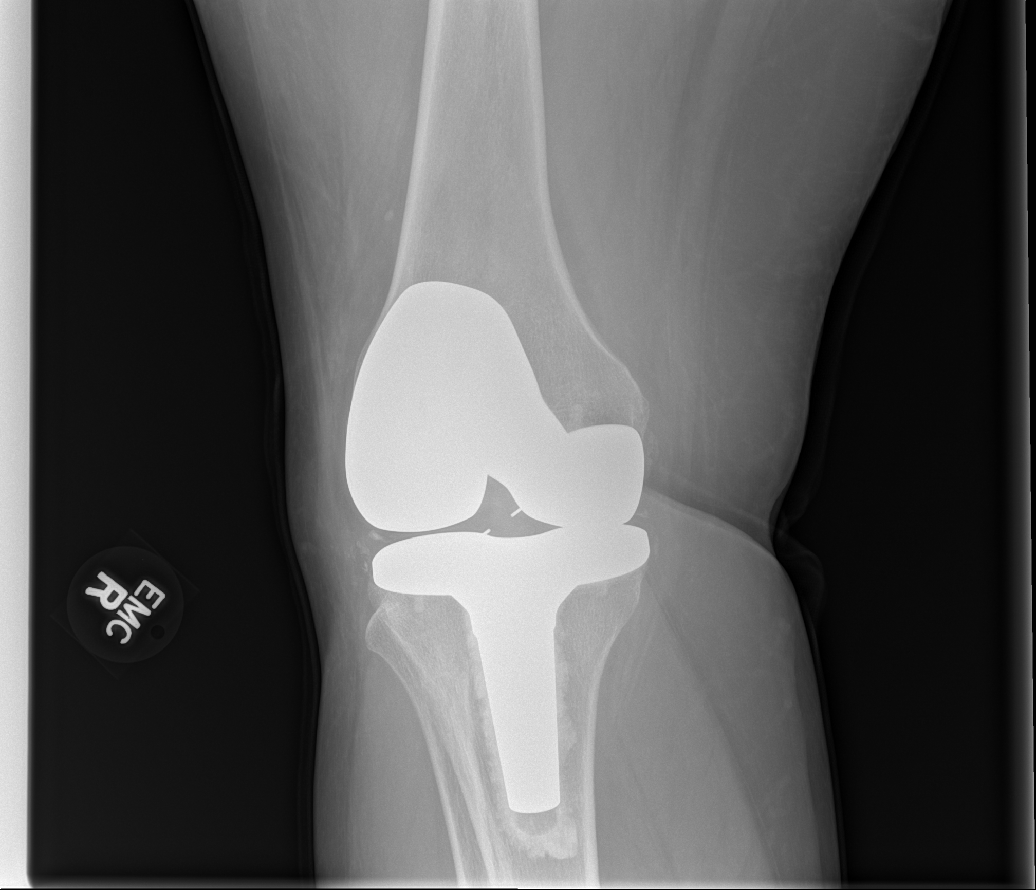

[x knee ap right (3 of 3)]
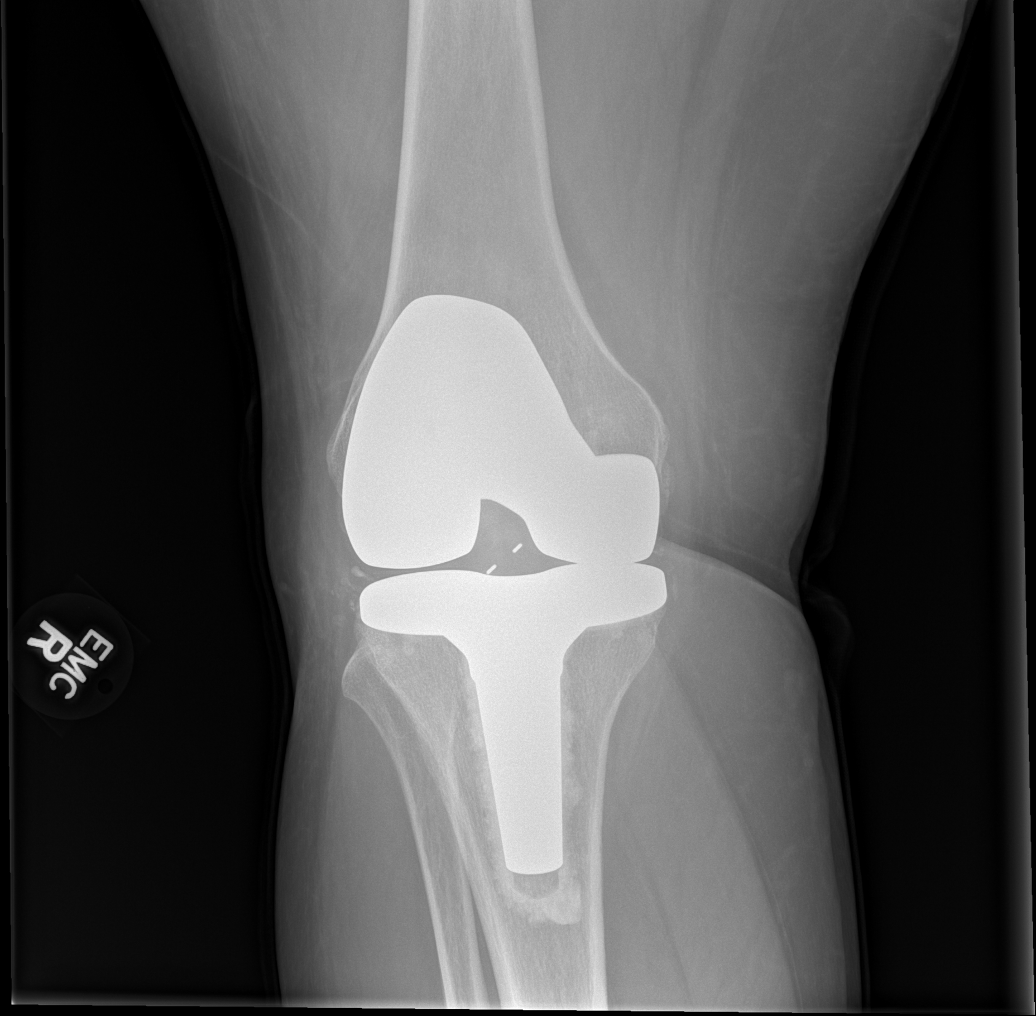

[5 of 5 positions shown; findings below may reference images not displayed]

FINDINGS: Components of RIGHT knee prosthesis stable in position.

Bones appear demineralized.

No acute fracture, dislocation, or bone destruction.

No knee joint effusion.

Soft tissues unremarkable.
IMPRESSION: RIGHT knee prosthesis.

No acute abnormalities.

## 2017-10-13 ENCOUNTER — Emergency Department (HOSPITAL_COMMUNITY): Payer: Medicare Other

## 2017-10-13 ENCOUNTER — Emergency Department (HOSPITAL_COMMUNITY)
Admission: EM | Admit: 2017-10-13 | Discharge: 2017-10-13 | Disposition: A | Discharge status: Home / Self Care | Payer: Medicare Other | Attending: Emergency Medicine | Admitting: Emergency Medicine

## 2017-10-13 ENCOUNTER — Encounter (HOSPITAL_COMMUNITY): Payer: Self-pay | Admitting: *Deleted

## 2017-10-13 DIAGNOSIS — Y998 Other external cause status: Secondary | ICD-10-CM | POA: Insufficient documentation

## 2017-10-13 DIAGNOSIS — I10 Essential (primary) hypertension: Secondary | ICD-10-CM | POA: Diagnosis not present

## 2017-10-13 DIAGNOSIS — M545 Low back pain, unspecified: Secondary | ICD-10-CM

## 2017-10-13 DIAGNOSIS — W01198A Fall on same level from slipping, tripping and stumbling with subsequent striking against other object, initial encounter: Secondary | ICD-10-CM | POA: Diagnosis not present

## 2017-10-13 DIAGNOSIS — Y929 Unspecified place or not applicable: Secondary | ICD-10-CM | POA: Diagnosis not present

## 2017-10-13 DIAGNOSIS — Y9389 Activity, other specified: Secondary | ICD-10-CM | POA: Diagnosis not present

## 2017-10-13 DIAGNOSIS — S8991XA Unspecified injury of right lower leg, initial encounter: Secondary | ICD-10-CM | POA: Diagnosis present

## 2017-10-13 DIAGNOSIS — S86911A Strain of unspecified muscle(s) and tendon(s) at lower leg level, right leg, initial encounter: Secondary | ICD-10-CM

## 2017-10-13 DIAGNOSIS — Z96653 Presence of artificial knee joint, bilateral: Secondary | ICD-10-CM | POA: Diagnosis not present

## 2017-10-13 DIAGNOSIS — S8391XA Sprain of unspecified site of right knee, initial encounter: Secondary | ICD-10-CM | POA: Insufficient documentation

## 2017-10-13 DIAGNOSIS — W19XXXA Unspecified fall, initial encounter: Secondary | ICD-10-CM

## 2017-10-13 MED ORDER — ORPHENADRINE CITRATE ER 100 MG PO TB12: 100.0000 mg | ORAL_TABLET | Freq: Two times a day (BID) | ORAL | 0 refills | Status: AC

## 2017-10-13 NOTE — ED Provider Notes (Signed)
[facility] EMERGENCY DEPARTMENT Provider Note   CSN: [id number] Arrival date & time: 10/13/17  1251     History   Chief Complaint Chief Complaint  Patient presents with  . Fall    HPI Whitney [name] is a 52 y.o. female.  52 year old female presents with injuries from a fall.  Patient states that she has an abscess on her left foot which causes her great pain with walking and caused her to fall this morning injuring her right knee and right lower back.  Patient denies feeling weak or dizzy prior to her fall.  Is worse with trying to bear weight on her right leg.  Patient reports having knee replacement surgery several years ago local, then moved to [place] where she was treated for the left foot abscess in the emergency room.  No other injuries, complaints, concerns.     Past Medical History:  Diagnosis Date  . Arthritis   . Hypertension   . Hypoglycemia   . Osteoporosis   . Psoriatic arthritis (HCC) 03/2013  . Snores     Patient Active Problem List   Diagnosis Date Noted  . Migraine 07/25/2015  . Primary amenorrhea 07/25/2015  . 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 Obesity, unspecified 09/07/2013  . Psoriatic arthritis (HCC) 04/06/2013    Past Surgical History:  Procedure Laterality Date  . JOINT REPLACEMENT Left 5/14   knee  . KNEE ARTHROSCOPY Right 06/08/2013   Procedure: RIGHT ARTHROSCOPY KNEE W/ PARTIAL MEDIAL & LATERAL MENISECTOMY & CHONDROPLASTY;  Surgeon: Velna Ochs, MD;  Location: [facility];  Service: Orthopedics;  Laterality: Right;  . KNEE SURGERY    . TOTAL KNEE ARTHROPLASTY Right 09/07/2013   Procedure: RIGHT TOTAL KNEE ARTHROPLASTY;  Surgeon: Velna Ochs, MD;  Location: [facility] OR;  Service: Orthopedics;  Laterality: Right;     OB History   None      Home Medications    Prior to Admission medications   Medication Sig Start Date End Date Taking? Authorizing Provider  aspirin EC 325 MG EC tablet Take 1 tablet  (325 mg total) by mouth 2 (two) times daily after a meal. Patient not taking: Reported on 07/25/2015 09/10/13   Elodia Florence, PA-C  augmented betamethasone dipropionate (DIPROLENE-AF) 0.05 % cream Apply 1 application topically daily as needed (psoriasis). Patient not taking: Reported on 07/25/2015 04/30/13   Carmelina Dane, MD  folic acid (FOLVITE) 1 MG tablet Take 1 tablet (1 mg total) by mouth 2 (two) times daily. Patient not taking: Reported on 07/25/2015 04/30/13   Carmelina Dane, MD  HUMIRA PEN 40 MG/0.8ML PNKT Inject 40 mg as directed once a week. Reported on 07/25/2015 08/04/13   [provider]  methocarbamol (ROBAXIN) 500 MG tablet Take 1 tablet (500 mg total) by mouth every 6 (six) hours as needed for muscle spasms. Patient not taking: Reported on 07/25/2015 03/10/15   Charlynne Pander, MD  methotrexate (RHEUMATREX) 2.5 MG tablet Take 8 tablets (20 mg total) by mouth once a week. Each Friday Caution:Chemotherapy. Protect from light. Patient not taking: Reported on 07/25/2015 04/30/13   Carmelina Dane, MD  mometasone (ELOCON) 0.1 % cream Apply 1 application topically daily. 07/25/15   Funches, Gerilyn Nestle, MD  orphenadrine (NORFLEX) 100 MG tablet Take 1 tablet (100 mg total) by mouth 2 (two) times daily for 10 days. 10/13/17 10/23/17  Jeannie Fend, PA-C  oxyCODONE-acetaminophen (PERCOCET) 10-325 MG tablet Take 1 tablet by mouth every 4 (four) hours  as needed for pain. Reported on 07/25/2015    [provider]  predniSONE (DELTASONE) 20 MG tablet Take 2 tablets (40 mg total) by mouth daily with breakfast. 07/25/15   Dessa Phi, MD  propranolol ER (INDERAL LA) 60 MG 24 hr capsule Take 1 capsule (60 mg total) by mouth at bedtime. 07/25/15   Funches, Gerilyn Nestle, MD  ranitidine (ZANTAC) 300 MG tablet Take 300 mg by mouth daily. Reported on 07/25/2015    [provider]  sulfaDIAZINE 500 MG tablet Take 500 mg by mouth daily. Reported on 07/25/2015    [provider]     Family History Family History  Problem Relation Age of Onset  . Kidney disease Mother   . Diabetes Mother   . Hypertension Mother   . Heart disease Mother   . Cancer Father   . Diabetes Father   . Hypertension Father   . Heart disease Father   . Arthritis Sister   . Cirrhosis Sister   . Diabetes Sister   . Suicidality Brother     Social History Social History   Tobacco Use  . Smoking status: Never Smoker  Substance Use Topics  . Alcohol use: No  . Drug use: No     Allergies   Ibuprofen; Tramadol; and Hydrocodone   Review of Systems Review of Systems  Constitutional: Negative for fever.  Musculoskeletal: Positive for arthralgias, back pain, gait problem, joint swelling and myalgias. Negative for neck pain and neck stiffness.  Skin: Negative for rash and wound.  Allergic/Immunologic: Negative for immunocompromised state.  Neurological: Negative for dizziness, weakness and numbness.  Hematological: Does not bruise/bleed easily.  Psychiatric/Behavioral: Negative for confusion.  All other systems reviewed and are negative.    Physical Exam Updated Vital Signs BP 134/69 (BP Location: Right Arm)   Pulse 66   Temp 97.8 F (36.6 C) (Oral)   Resp 18   SpO2 98%   Physical Exam  Constitutional: She is oriented to person, place, and time. She appears well-developed and well-nourished. No distress.  HENT:  Head: Normocephalic and atraumatic.  Eyes: Conjunctivae are normal.  Neck: Normal range of motion.  Cardiovascular: Intact distal pulses.  Pulmonary/Chest: Effort normal.  Musculoskeletal: She exhibits tenderness. She exhibits no deformity.       Right knee: She exhibits normal range of motion, no swelling, no effusion, no ecchymosis, no deformity, no laceration and no erythema. Tenderness found.       Lumbar back: She exhibits tenderness. She exhibits no bony tenderness, no swelling, no edema, no deformity and no laceration.       Back:  Right lower back  pain, no midline or bony tenderness. Diffuse right knee tenderness, well-healed midline surgical incision, no abrasions or effusion, patient does not tolerate testing for ligamentous laxity. Small plantars wart noted at the left fifth MTP.  Neurological: She is alert and oriented to person, place, and time. No sensory deficit.  Skin: Skin is warm and dry. No rash noted. She is not diaphoretic.  Psychiatric: She has a normal mood and affect. Her behavior is normal.  Nursing note and vitals reviewed.    ED Treatments / Results  Labs (all labs ordered are listed, but only abnormal results are displayed) Labs Reviewed - No data to display  EKG None  Radiology Dg Knee Complete 4 Views Right  Result Date: 10/13/2017 CLINICAL DATA:  Fall EXAM: RIGHT KNEE - COMPLETE 4+ VIEW COMPARISON:  10/06/2014 FINDINGS: Prior right knee replacement. No acute bony abnormality.  Specifically, no fracture, subluxation, or dislocation. No hardware complicating feature. No joint effusion. IMPRESSION: Prior right knee replacement.  No acute bony abnormality. Electronically Signed   By: Charlett Nose M.D.   On: 10/13/2017 13:42   Dg Foot Complete Left  Result Date: 10/13/2017 CLINICAL DATA:  Fall with right leg pain.  Initial encounter. EXAM: LEFT FOOT - COMPLETE 3+ VIEW COMPARISON:  None. FINDINGS: There is no evidence of fracture or dislocation. There is no evidence of arthropathy or other focal bone abnormality. Soft tissues are unremarkable. Heel spurs. IMPRESSION: Negative for fracture. Electronically Signed   By: Marnee Spring M.D.   On: 10/13/2017 13:46    Procedures Procedures (including critical care time)  Medications Ordered in ED Medications - No data to display   Initial Impression / Assessment and Plan / ED Course  I have reviewed the triage vital signs and the nursing notes.  Pertinent labs & imaging results that were available during my care of the patient were reviewed by me and considered  in my medical decision making (see chart for details).  Clinical Course as of Oct 13 1440  Mon Oct 13, 2017  4026 52 year old female presents with right knee pain and right low back pain after fall today. Interpreter used. Patient states that she has an abscess to the left foot which causes her great pain with walking and caused her to fall today injuring her knee and back.  On exam patient has a plantars wart on her foot, there is no evidence of infection or abscess.  She has diffuse right knee tenderness without effusion, x-ray is negative for fracture, shows right knee replacement.  Right low back pain, no midline or bony tenderness.  Patient will be given an Ace wrap for her right knee and crutches, referred to orthopedics for follow-up.  Patient requests referral back to the orthopedic surgeon who did her knee replacement in 2015 and was given referral to [facility] orthopedic.  Patient has many pain allergy medications including ibuprofen, tramadol, Vicodin and states these give her hives and cause swelling.  Patient was given prescription for Norflex for her pain.   [LM]    Clinical Course User Index [LM] Jeannie Fend, PA-C   Final Clinical Impressions(s) / ED Diagnoses   Final diagnoses:  Fall, initial encounter  Knee strain, right, initial encounter  Acute right-sided low back pain without sciatica    ED Discharge Orders        Ordered    orphenadrine (NORFLEX) 100 MG tablet  2 times daily     10/13/17 1432       Jeannie Fend, PA-C 10/13/17 1443    Gwyneth Sprout, MD 10/13/17 2034

## 2017-10-13 NOTE — Discharge Instructions (Addendum)
Elevate your right leg and apply ice for 20 minutes at a time.  Take Norflex as prescribed for pain.  Your medical records indicate you were seen by [facility] orthopedics, their number was provided for you for follow-up.

## 2017-10-13 NOTE — ED Triage Notes (Signed)
Pt in after a fall, c/o pain to her right lower back radiating down into her right leg, also pain to her right knee and left foot, swelling noted to right knee

## 2017-10-13 NOTE — ED Notes (Signed)
Pt verbalized understanding of discharge instructions and denies any further questions at this time.   

## 2017-10-13 NOTE — ED Notes (Signed)
Patient transported to X-ray
